# Patient Record
Sex: Female | Born: 1980
Health system: Southern US, Community
[De-identification: ages and names within clinical notes are randomized; demographics above are authoritative.]

## PROBLEM LIST (undated history)

## (undated) DIAGNOSIS — R519 Headache, unspecified: Secondary | ICD-10-CM

## (undated) DIAGNOSIS — Z9889 Other specified postprocedural states: Secondary | ICD-10-CM

## (undated) DIAGNOSIS — I10 Essential (primary) hypertension: Secondary | ICD-10-CM

## (undated) DIAGNOSIS — R51 Headache: Secondary | ICD-10-CM

## (undated) DIAGNOSIS — D649 Anemia, unspecified: Secondary | ICD-10-CM

## (undated) DIAGNOSIS — R112 Nausea with vomiting, unspecified: Secondary | ICD-10-CM

## (undated) DIAGNOSIS — F988 Other specified behavioral and emotional disorders with onset usually occurring in childhood and adolescence: Secondary | ICD-10-CM

## (undated) DIAGNOSIS — R509 Fever, unspecified: Secondary | ICD-10-CM

## (undated) HISTORY — DX: Essential (primary) hypertension: I10

## (undated) HISTORY — DX: Fever, unspecified: R50.9

## (undated) HISTORY — PX: DIAPHRAGM SURGERY: SHX612

## (undated) HISTORY — PX: NISSEN FUNDOPLICATION: SHX2091

## (undated) HISTORY — PX: SPINE SURGERY: SHX786

## (undated) HISTORY — PX: SPINAL CORD STIMULATOR INSERTION: SHX5378

## (undated) HISTORY — DX: Anemia, unspecified: D64.9

## (undated) HISTORY — PX: TONSILLECTOMY: SHX5217

## (undated) HISTORY — PX: SHOULDER SURGERY: SHX246

## (undated) HISTORY — DX: Other specified behavioral and emotional disorders with onset usually occurring in childhood and adolescence: F98.8

---
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
## 2003-12-11 ENCOUNTER — Ambulatory Visit (HOSPITAL_COMMUNITY): Admission: RE | Admit: 2003-12-11 | Discharge: 2003-12-11 | Payer: Self-pay | Admitting: Ophthalmology

## 2003-12-17 ENCOUNTER — Other Ambulatory Visit: Admission: RE | Admit: 2003-12-17 | Discharge: 2003-12-17 | Payer: Self-pay | Admitting: Obstetrics and Gynecology

## 2003-12-18 ENCOUNTER — Encounter: Admission: RE | Admit: 2003-12-18 | Discharge: 2003-12-18 | Payer: Self-pay | Admitting: Neurology

## 2004-02-23 ENCOUNTER — Emergency Department (HOSPITAL_COMMUNITY): Admission: EM | Admit: 2004-02-23 | Discharge: 2004-02-23 | Payer: Self-pay | Admitting: Emergency Medicine

## 2004-04-08 ENCOUNTER — Encounter
Admission: RE | Admit: 2004-04-08 | Discharge: 2004-04-08 | Payer: Self-pay | Admitting: Physical Medicine & Rehabilitation

## 2004-04-13 ENCOUNTER — Ambulatory Visit (HOSPITAL_BASED_OUTPATIENT_CLINIC_OR_DEPARTMENT_OTHER): Admission: RE | Admit: 2004-04-13 | Discharge: 2004-04-13 | Payer: Self-pay | Admitting: Orthopedic Surgery

## 2004-06-01 ENCOUNTER — Encounter: Admission: RE | Admit: 2004-06-01 | Discharge: 2004-06-01 | Payer: Self-pay | Admitting: Orthopedic Surgery

## 2005-01-28 ENCOUNTER — Emergency Department (HOSPITAL_COMMUNITY): Admission: EM | Admit: 2005-01-28 | Discharge: 2005-01-28 | Payer: Self-pay | Admitting: Emergency Medicine

## 2005-04-07 ENCOUNTER — Other Ambulatory Visit: Admission: RE | Admit: 2005-04-07 | Discharge: 2005-04-07 | Payer: Self-pay | Admitting: Obstetrics and Gynecology

## 2006-07-05 ENCOUNTER — Encounter: Admission: RE | Admit: 2006-07-05 | Discharge: 2006-07-05 | Payer: Self-pay | Admitting: Orthopaedic Surgery

## 2006-10-11 ENCOUNTER — Encounter
Admission: RE | Admit: 2006-10-11 | Discharge: 2007-01-09 | Payer: Self-pay | Admitting: Physical Medicine and Rehabilitation

## 2006-10-11 ENCOUNTER — Ambulatory Visit: Payer: Self-pay | Admitting: Physical Medicine and Rehabilitation

## 2006-12-06 ENCOUNTER — Ambulatory Visit: Payer: Self-pay | Admitting: Physical Medicine and Rehabilitation

## 2007-01-01 ENCOUNTER — Encounter
Admission: RE | Admit: 2007-01-01 | Discharge: 2007-01-01 | Payer: Self-pay | Admitting: Physical Medicine and Rehabilitation

## 2007-01-03 ENCOUNTER — Encounter
Admission: RE | Admit: 2007-01-03 | Discharge: 2007-01-10 | Payer: Self-pay | Admitting: Physical Medicine and Rehabilitation

## 2007-02-10 ENCOUNTER — Encounter
Admission: RE | Admit: 2007-02-10 | Discharge: 2007-05-11 | Payer: Self-pay | Admitting: Physical Medicine and Rehabilitation

## 2007-02-10 ENCOUNTER — Ambulatory Visit: Payer: Self-pay | Admitting: Physical Medicine and Rehabilitation

## 2007-04-11 ENCOUNTER — Ambulatory Visit: Payer: Self-pay | Admitting: Physical Medicine and Rehabilitation

## 2007-05-08 ENCOUNTER — Encounter
Admission: RE | Admit: 2007-05-08 | Discharge: 2007-08-06 | Payer: Self-pay | Admitting: Physical Medicine and Rehabilitation

## 2007-05-10 ENCOUNTER — Ambulatory Visit: Payer: Self-pay | Admitting: Physical Medicine and Rehabilitation

## 2007-06-07 ENCOUNTER — Ambulatory Visit: Payer: Self-pay | Admitting: Physical Medicine and Rehabilitation

## 2007-07-25 ENCOUNTER — Encounter
Admission: RE | Admit: 2007-07-25 | Discharge: 2007-10-23 | Payer: Self-pay | Admitting: Physical Medicine and Rehabilitation

## 2007-07-28 ENCOUNTER — Ambulatory Visit: Payer: Self-pay | Admitting: Physical Medicine and Rehabilitation

## 2007-08-02 ENCOUNTER — Encounter
Admission: RE | Admit: 2007-08-02 | Discharge: 2007-08-02 | Payer: Self-pay | Admitting: Physical Medicine and Rehabilitation

## 2007-09-01 ENCOUNTER — Ambulatory Visit: Payer: Self-pay | Admitting: Physical Medicine and Rehabilitation

## 2007-10-13 ENCOUNTER — Ambulatory Visit: Payer: Self-pay | Admitting: Physical Medicine and Rehabilitation

## 2007-11-10 ENCOUNTER — Encounter
Admission: RE | Admit: 2007-11-10 | Discharge: 2008-02-08 | Payer: Self-pay | Admitting: Physical Medicine & Rehabilitation

## 2007-11-13 ENCOUNTER — Ambulatory Visit: Payer: Self-pay | Admitting: Physical Medicine & Rehabilitation

## 2007-11-23 ENCOUNTER — Encounter
Admission: RE | Admit: 2007-11-23 | Discharge: 2008-02-21 | Payer: Self-pay | Admitting: Physical Medicine and Rehabilitation

## 2007-11-24 ENCOUNTER — Ambulatory Visit: Payer: Self-pay | Admitting: Physical Medicine and Rehabilitation

## 2007-12-14 ENCOUNTER — Ambulatory Visit: Payer: Self-pay | Admitting: Physical Medicine & Rehabilitation

## 2007-12-25 ENCOUNTER — Ambulatory Visit: Payer: Self-pay | Admitting: Physical Medicine and Rehabilitation

## 2008-01-11 ENCOUNTER — Ambulatory Visit: Payer: Self-pay | Admitting: Physical Medicine & Rehabilitation

## 2008-01-22 ENCOUNTER — Encounter: Admission: RE | Admit: 2008-01-22 | Discharge: 2008-01-22 | Payer: Self-pay | Admitting: Anesthesiology

## 2008-01-22 ENCOUNTER — Ambulatory Visit: Payer: Self-pay | Admitting: Physical Medicine and Rehabilitation

## 2008-01-29 ENCOUNTER — Ambulatory Visit (HOSPITAL_COMMUNITY)
Admission: RE | Admit: 2008-01-29 | Discharge: 2008-01-29 | Payer: Self-pay | Admitting: Physical Medicine and Rehabilitation

## 2008-02-21 ENCOUNTER — Encounter: Admission: RE | Admit: 2008-02-21 | Discharge: 2008-02-21 | Payer: Self-pay | Admitting: Orthopaedic Surgery

## 2008-03-08 ENCOUNTER — Encounter
Admission: RE | Admit: 2008-03-08 | Discharge: 2008-06-06 | Payer: Self-pay | Admitting: Physical Medicine and Rehabilitation

## 2008-04-08 ENCOUNTER — Ambulatory Visit: Payer: Self-pay | Admitting: Physical Medicine and Rehabilitation

## 2008-05-31 ENCOUNTER — Encounter: Payer: Self-pay | Admitting: Orthopedic Surgery

## 2008-05-31 ENCOUNTER — Ambulatory Visit (HOSPITAL_COMMUNITY): Admission: RE | Admit: 2008-05-31 | Discharge: 2008-05-31 | Payer: Self-pay | Admitting: Radiology

## 2008-06-04 ENCOUNTER — Encounter (INDEPENDENT_AMBULATORY_CARE_PROVIDER_SITE_OTHER): Payer: Self-pay | Admitting: Orthopedic Surgery

## 2008-06-04 ENCOUNTER — Inpatient Hospital Stay (HOSPITAL_COMMUNITY): Admission: RE | Admit: 2008-06-04 | Discharge: 2008-06-08 | Payer: Self-pay | Admitting: Orthopedic Surgery

## 2008-08-01 ENCOUNTER — Encounter: Admission: RE | Admit: 2008-08-01 | Discharge: 2008-08-01 | Payer: Self-pay | Admitting: Orthopedic Surgery

## 2008-10-11 ENCOUNTER — Encounter: Admission: RE | Admit: 2008-10-11 | Discharge: 2008-10-11 | Payer: Self-pay | Admitting: Orthopedic Surgery

## 2008-10-15 ENCOUNTER — Encounter: Admission: RE | Admit: 2008-10-15 | Discharge: 2008-10-15 | Payer: Self-pay | Admitting: Orthopedic Surgery

## 2008-12-09 ENCOUNTER — Encounter: Admission: RE | Admit: 2008-12-09 | Discharge: 2008-12-09 | Payer: Self-pay | Admitting: Orthopedic Surgery

## 2009-03-14 ENCOUNTER — Encounter: Admission: RE | Admit: 2009-03-14 | Discharge: 2009-03-14 | Payer: Self-pay | Admitting: Orthopedic Surgery

## 2010-02-28 ENCOUNTER — Encounter: Payer: Self-pay | Admitting: Ophthalmology

## 2010-03-01 ENCOUNTER — Encounter: Payer: Self-pay | Admitting: Physical Medicine and Rehabilitation

## 2010-05-12 ENCOUNTER — Encounter (HOSPITAL_COMMUNITY)
Admission: RE | Admit: 2010-05-12 | Discharge: 2010-05-12 | Disposition: A | Discharge status: Home / Self Care | Payer: PRIVATE HEALTH INSURANCE | Source: Ambulatory Visit | Attending: Obstetrics and Gynecology | Admitting: Obstetrics and Gynecology

## 2010-05-12 LAB — SURGICAL PCR SCREEN: Staphylococcus aureus: NEGATIVE

## 2010-05-12 LAB — CBC
Hemoglobin: 12.5 g/dL (ref 12.0–15.0)
MCH: 28 pg (ref 26.0–34.0)
MCV: 86.1 fL (ref 78.0–100.0)
RBC: 4.46 MIL/uL (ref 3.87–5.11)

## 2010-05-12 LAB — RPR: RPR Ser Ql: NONREACTIVE

## 2010-05-19 ENCOUNTER — Inpatient Hospital Stay (HOSPITAL_COMMUNITY)
Admission: RE | Admit: 2010-05-19 | Discharge: 2010-05-22 | DRG: 766 | Disposition: A | Discharge status: Home / Self Care | Payer: PRIVATE HEALTH INSURANCE | Source: Ambulatory Visit | Attending: Obstetrics and Gynecology | Admitting: Obstetrics and Gynecology

## 2010-05-19 DIAGNOSIS — O99892 Other specified diseases and conditions complicating childbirth: Principal | ICD-10-CM | POA: Diagnosis present

## 2010-05-19 DIAGNOSIS — M539 Dorsopathy, unspecified: Secondary | ICD-10-CM | POA: Diagnosis present

## 2010-05-19 LAB — PROTIME-INR
INR: 3.1 — ABNORMAL HIGH (ref 0.00–1.49)
Prothrombin Time: 33.9 seconds — ABNORMAL HIGH (ref 11.6–15.2)

## 2010-05-20 LAB — APTT: aPTT: 28 seconds (ref 24–37)

## 2010-05-20 LAB — URINALYSIS, ROUTINE W REFLEX MICROSCOPIC
Bilirubin Urine: NEGATIVE
Glucose, UA: NEGATIVE mg/dL
Hgb urine dipstick: NEGATIVE
Ketones, ur: NEGATIVE mg/dL
Nitrite: NEGATIVE
Protein, ur: NEGATIVE mg/dL
Specific Gravity, Urine: 1.023 (ref 1.005–1.030)
Urobilinogen, UA: 0.2 mg/dL (ref 0.0–1.0)
pH: 6 (ref 5.0–8.0)

## 2010-05-20 LAB — BASIC METABOLIC PANEL
CO2: 28 mEq/L (ref 19–32)
Chloride: 103 mEq/L (ref 96–112)
Chloride: 105 mEq/L (ref 96–112)
GFR calc Af Amer: >60 mL/min (ref >60)
GFR calc Af Amer: >60 mL/min (ref >60)
GFR calc non Af Amer: >60 mL/min (ref >60)
Potassium: 4 mEq/L (ref 3.5–5.1)
Sodium: 133 mEq/L — ABNORMAL LOW (ref 135–145)
Sodium: 140 mEq/L (ref 135–145)

## 2010-05-20 LAB — COMPREHENSIVE METABOLIC PANEL
AST: 17 U/L (ref 0–37)
Albumin: 3.8 g/dL (ref 3.5–5.2)
Alkaline Phosphatase: 54 U/L (ref 39–117)
CO2: 25 mEq/L (ref 19–32)
Chloride: 107 mEq/L (ref 96–112)
Creatinine, Ser: 0.73 mg/dL (ref 0.4–1.2)
GFR calc Af Amer: >60 mL/min (ref >60)
GFR calc non Af Amer: >60 mL/min (ref >60)
Potassium: 4.6 mEq/L (ref 3.5–5.1)
Total Bilirubin: 0.5 mg/dL (ref 0.3–1.2)

## 2010-05-20 LAB — CBC
HCT: 35 % — ABNORMAL LOW (ref 36.0–46.0)
HCT: 40.9 % (ref 36.0–46.0)
Hemoglobin: 8.1 g/dL — ABNORMAL LOW (ref 12.0–15.0)
Hemoglobin: 10.4 g/dL — ABNORMAL LOW (ref 12.0–15.0)
Hemoglobin: 11.8 g/dL — ABNORMAL LOW (ref 12.0–15.0)
MCHC: 32.3 g/dL (ref 30.0–36.0)
MCHC: 34.7 g/dL (ref 30.0–36.0)
MCV: 87.4 fL (ref 78.0–100.0)
MCV: 88.3 fL (ref 78.0–100.0)
MCV: 87.6 fL (ref 78.0–100.0)
Platelets: 159 10*3/uL (ref 150–400)
Platelets: 333 10*3/uL (ref 150–400)
RBC: 3.43 MIL/uL — ABNORMAL LOW (ref 3.87–5.11)
RBC: 3.96 MIL/uL (ref 3.87–5.11)
RBC: 4.67 MIL/uL (ref 3.87–5.11)
WBC: 13.4 10*3/uL — ABNORMAL HIGH (ref 4.0–10.5)
WBC: 9.6 10*3/uL (ref 4.0–10.5)
WBC: 8.4 10*3/uL (ref 4.0–10.5)

## 2010-05-20 LAB — URINE CULTURE

## 2010-05-20 LAB — VITAMIN D 25 HYDROXY (VIT D DEFICIENCY, FRACTURES): Vit D, 25-Hydroxy: 33 ng/mL (ref 30–89)

## 2010-05-20 LAB — TYPE AND SCREEN
ABO/RH(D): A POS
ABO/RH(D): A POS
Antibody Screen: NEGATIVE
Antibody Screen: NEGATIVE

## 2010-05-20 LAB — DIFFERENTIAL
Lymphocytes Relative: 26 % (ref 12–46)
Lymphs Abs: 2.1 10*3/uL (ref 0.7–4.0)
Neutrophils Relative %: 64 % (ref 43–77)

## 2010-05-20 LAB — URINE MICROSCOPIC-ADD ON

## 2010-05-20 LAB — PROTIME-INR
INR: 1.6 — ABNORMAL HIGH (ref 0.00–1.49)
INR: 3.4 — ABNORMAL HIGH (ref 0.00–1.49)
INR: 1 (ref 0.00–1.49)
Prothrombin Time: 20.2 seconds — ABNORMAL HIGH (ref 11.6–15.2)
Prothrombin Time: 12.9 seconds (ref 11.6–15.2)

## 2010-05-20 LAB — ABO/RH: ABO/RH(D): A POS

## 2010-05-26 ENCOUNTER — Inpatient Hospital Stay (HOSPITAL_COMMUNITY): Admission: AD | Admit: 2010-05-26 | Payer: Self-pay | Source: Home / Self Care | Admitting: Obstetrics and Gynecology

## 2010-05-27 ENCOUNTER — Ambulatory Visit (HOSPITAL_COMMUNITY)
Admission: RE | Admit: 2010-05-27 | Discharge: 2010-05-27 | Disposition: A | Discharge status: Home / Self Care | Payer: PRIVATE HEALTH INSURANCE | Source: Ambulatory Visit | Attending: Obstetrics and Gynecology | Admitting: Obstetrics and Gynecology

## 2010-06-23 NOTE — Assessment & Plan Note (Signed)
[name] is a 30 year old single female who is followed in our  [facility] for chronic low back pain.  She is known  to have scoliosis as well as degenerative disk problems, mild facet  arthropathy at the L5-S1 segment.   She has returned to clinic for a refill of her medications and to  discuss possible injections for her lumbar spine to help manage her  pain.   She is also known to have stable L1 and L2 superior endplate  deformities.  She states her average pain is about a 7 on a scale of 10.  Generally activity is moderately affected.  Sleep tends to be poor.  She  gets fair relief with current medications.  Her pain is localized across  the low back worse on the left lumbosacral junction region.  She does  have some pain which radiates down the left leg to the foot as well.  The pain is worse with coughing or sneezing, typically she does get some  numbness in the foot on occasion especially after she has been up for a  while.  She also has some pain with extension.  She has difficulty  bearing full weight through the left lower extremity.   FUNCTIONAL STATUS:  She is able to walk about 4-5 minutes at a time.  She is able to climb stairs.  She does not drive.  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  She is independent with self-care.  She  needs assistance with heavier household tasks.   She denies problems controlling her bowel or bladder.  She reports no  problems with depression, anxiety, or suicidal ideations.  She has had  no changes on review of systems, her past medical, social, or family  histories.   [facility]:  1. Ultracet 1 p.o. at bedtime.  2. Tizanidine 4 mg 1 p.o. at bedtime.   She does not take nonsteroidal antiinflammatory medication at this time,  but would like to consider taking an occasional ibuprofen.   PHYSICAL EXAMINATION:  VITAL SIGNS:  Are as follows;  128/64, pulse 73,  respiration 18, and 99% saturated on room air.  GENERAL:  She is a well-developed, well-nourished woman who does not  appear in any distress.  She is oriented x3.  Her speech is clear.  Her  affect is bright.  She is alert, cooperative, pleasant, and articulate.   She has an obvious thoracolumbar scoliosis.  She stands with her left  knee slightly flexed with more weight distributed throughout the right  lower extremity.  She states she tends to bear more weight as she is  standing in the forefoot of her left foot.   She has significant limitations in the lumbar motion in all planes, not  more than 5-10 degrees of forward flexion, extension, and lateral  flexion.   She ambulates with asymmetry and an antalgic gait.   As she is seated, her reflexes are noted to be 2+ at the patellar and  Achilles tendons.  She does not have abnormal tone.  Her motor strength  is 5/5 at hip flexors, knee extensors, dorsiflexors, and plantar  flexors.  EHL is slightly weaker on the left than on the right by half a  grade.  Straight leg raise appears to be negative bilaterally.  Internal  and external rotation of the hip does not bother her.   She has significant tenderness to palpation throughout  the lumbar  paraspinal musculature especially along the lumbosacral junction.   IMPRESSION:  1. Chronic low back pain, scoliosis convexed to the right with mild      wedge deformity at L1-L2.  2. Degenerative facet changes on the left L5-S1.  3. History of disc protrusion at multiple levels L2-3, mild left      lateral disc protrusion L3-4, mild left lateral disc protrusion L4-      5, left eccentric broad based disc protrusion with lateral recess      and bilateral component, mild narrowing of the left lateral recess      stenosis with mild facet ligamentum flavum hypertrophy.   PLAN:  We will refill the following medications for her.  Tizanidine 4  mg 1 p.o. at bedtime, #30 and  Ultracet 1 p.o. at bedtime p.r.n. back or  leg pain, #30, 3 refills of both.   She is interested in pursuing injections to help alleviate her pain if  that is possible.  We discussed medial branch block injections,  sacroiliac joint injections, and lumbar epidural steroid injections.   Given the fact that she does have some slight EHL weakness on the left  as well as increased pain with coughing and sneezing.  A recent MRI done  on August 02, 2007, suggested some narrowing of the left lateral recess at  L4-5, may consider starting with a lumbar epidural steroid injection and  assessing pain.  She understands that her pain is most likely caused by  multiple factors and may consider other injections to help manage her  pain in the future if the epidural steroid is not particularly  successful for her.  We will see her back in a month.  We will have her  set up for a lumbar epidural steroid injection to see if we can improve  some of the left buttock and leg components of her pain.  Her Oswestry  percentage of disability today is 53%.           ______________________________  Brantley Stage, M.D.     DMK/MedQ  D:  10/13/2007 10:15:23  T:  10/14/2007 02:03:17  Job #:  [id number]

## 2010-06-23 NOTE — Assessment & Plan Note (Signed)
Dominique Hughes is a 30 year old single female who is followed in our pain  and rehabilitative clinic.  Her chief complaint is low back pain.  She  recently followed up with Dr. Jeral Fruit who has sent a consultation report  which is attached to the chart today.   Dominique Hughes is requesting a refill of her tizanidine and Ultracet today.  She states she has had a bout of pneumonia and was on some antibiotics  earlier this month, and was unable to attend physical therapy.  She will  be starting therapy next week, however.   She states she was unable to get her hip x-rays, but intends to do this  within the next couple of weeks.   Her average pain is about a 7 on a scale of 10, localized to the low  lumbar region, worse with activity, walking, bending, sitting.  Improves  with rest and medication.   Pain is described as constant, sharp and aching in nature, occasionally  stabbing as well.   She gets fair relief with the current meds prescribed by our clinic.   She is able to walk between 4 and 5 minutes at a time.  She is able to  climb stairs.  She is currently not driving.  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   She is independent with self care, needs some assistance with higher  level household tasks.   Admits to some difficulty with walking.   REVIEW OF SYSTEMS:  Noncontributory.   PAST MEDICAL/SOCIAL/FAMILY HISTORY:  Otherwise unchanged other than that  already noted.   MEDICATIONS PROVIDED BY OUR CLINIC:  1. Ultracet 1 p.o. nightly p.r.n. #30.  2. Lidoderm p.r.n.  3. Tizanidine 4 mg 1 p.o. nightly.   She also takes Adderall XR, metoprolol, and Ortho Tri-Cyclen Lo.   PHYSICAL EXAMINATION:  Blood pressure is 142/76, pulse 79, respirations  18, and 98% saturated on room air.  She is a well-developed, well-nourished female who does not appear in  any distress.  She is oriented x3, her speech is clear.  She does sound  a little bit congested, yet her affect is bright, she  is alert,  cooperative and pleasant.  She follows commands without difficulty.  Transitioning from sitting to standing is done with ease.  Gait in the  room is slightly asymmetric.  She does not have a heel lift yet.  Lumbar flexion, extension are limited to less than 5 degrees in either  direction.  Has tenderness in the lumbar area and paraspinal musculature  with palpation.  Reflexes are slightly brisk without abnormal tone noted in the lower  extremities.  No clonus was noted.  Motor strength was 5/5 at hip flexors, knee extensors, dorsiflexors,  plantar flexors.  She does have some difficulty giving full effort due to increased pain  during exertion at hip flexion and knee extension.  Internal and external rotation of the hips are assessed, significantly  decreased internal rotation in the right hip, less than 5 to 10 degrees  with internal rotation.   IMPRESSION:  1. Chronic low back pain.  2. Thoracolumbar scoliosis.  3. Mild wedge deformity at L1-L2 per MRI.  4. Degenerative facet changes on left L5-S1.  5. Small central disk protrusion T7-T8, and at C5-C6.  6. Significant decreased range of motion in the right hip.   PLAN:  Will refill her tizanidine 4 mg 1 p.o. nightly, also will refill  her Ultracet 1 p.o. nightly p.r.n. back pain.  Awaiting radiographs of  her hips.  She will be getting physical therapy next month, have them  assess her for heel lift.  Also consider corset for her flare ups of the  low back.   Will see her back in a month.  Dominique Hughes has not exhibited any aberrant  behavior with her medication use.  She takes her medications as  prescribed, and is planning to participate in rehabilitation.  I will  see her back in a month.           ______________________________  Brantley Stage, M.D.     DMK/MedQ  D:  04/12/2007 09:35:28  T:  04/12/2007 10:17:03  Job #:  [id number]
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
## 2010-06-23 NOTE — Assessment & Plan Note (Signed)
Dominique Hughes is a pleasant 30 year old woman, who is followed in our [facility], who is here for a brief recheck.  She  underwent a second round of medial branch block on January 11, 2008.  She reports about 35-40% improvement for about an hour after the  injection.  This is similar to the first round of medial branch blocks  she had in early November.   She is back in today and reports the average pain about 8 on a scale of  10 with very little ability to move her low back.  Pain is fairly  constant, worse in the evening and at night.  Sleep tends to be poor.  She has pain with any kind of walking, bending, prolonged sitting or  standing, improves with rest and medication.  She gets fair relief with  current meds.   FUNCTIONAL STATUS:  She is able to walk 4-5 minutes at a time.  She can  climb stairs.  She does not drive.  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   No change in past medical, social, or family histories since last visit.   PHYSICAL EXAMINATION:  VITAL SIGNS:  Blood pressure is 120/85,  respirations 18, and 97% saturated on room air.  GENERAL:  She is a well-developed, well-nourished woman, who does not  appear in any distress.  She is oriented x3.  Speech is clear.  Affect  is bright.  She is alert, cooperative, and pleasant.  She follows  commands without difficulty and answerers questions appropriately.   Cranial nerves and coordination are grossly intact.  Reflexes are 2+ at  the patellar and Achilles tendons without any abnormal tone or clonus.  No tremors are appreciated as well.  Her sensation is intact in the  lower extremities.   She has significant limitations with respect to lumbar motion in all  planes, not more than 5-10 degrees in any direction.  She has quite a  bit of tenderness with palpation in the lower  lumbar segments and in the  paraspinal musculature.  Her motor strength, however, is 5/5 in the  lower extremities with the exception of hip flexors, which she is unable  to give full effort because of increased back pain during this maneuver.   IMPRESSION:  1. Chronic low back pain, scoliosis come back to the right with mild      wedge deformity, L1-2.  2. Degenerative facet changes on the left at L5-S1.  3. History of disk protrusion at multiple levels including L2-3,      mildly at L3-4 and L4-5.  4. She does not need refill on her tizanidine or Ultracet at this      time.  She would like to trial sacroiliac joint injection that she      does have pain over the sacroiliac joint and an antalgic gait with      pain over this area as well.  Pain does radiate through especially      the left posterior buttock region.  May also consider moving toward      a radiofrequency of the lumbar spine.  However, we would like to      trial the sacroiliac joint injection prior to moving toward the      radiofrequency.  We will see her back in a month.  ______________________________  Brantley Stage, M.D.     DMK/MedQ  D:  01/22/2008 12:51:42  T:  01/23/2008 06:26:13  Job #:  [id number]

## 2010-06-23 NOTE — Procedures (Signed)
NAME:  Dominique Hughes, SPERRY NO.:  [id number]   MEDICAL RECORD NO.:  [id number]          PATIENT TYPE:  REC   LOCATION:  [facility]                          FACILITY:  [facility]   PHYSICIAN:  Erick Colace, M.D.DATE OF BIRTH:  Oct 14, 1980   DATE OF PROCEDURE:  DATE OF DISCHARGE:                               OPERATIVE REPORT   PROCEDURE:  This is bilateral L5 dorsal ramus injection and bilateral L4  medial branch block.   INDICATIONS:  Low back pain, pain is only partially responsive to  medication management, history of scoliosis.   Informed consent was obtained after describing risks and benefits of the  procedure with the patient.  These include bleeding, bruising, and  infection.  She elects to proceed and has given written consent.  The  patient was placed prone on the fluoroscopy table, Betadine prepped,  sterile draped, and 25-gauge 1-1/2-inch needle was used to anesthetize  the skin and subcutaneous  tissue with 1% lidocaine x2 mL.  Then, a 22-  gauge, 2-1/2-inch spinal needle was inserted under fluoroscopic  guidance, first starting at the right S1, SAP, and sacroiliac junction,  bone contact made, confirmed on lateral imaging.  Omnipaque 180 x0.5 mL  demonstrated no intravascular uptake, then 0.5 mL of solution containing  1 mL of 4 mg/mL dexamethasone and 1 mL of 1% lidocaine.  Then, the left  L5 SAP transverse junction was targeted.  Bone contact made and  confirmed with lateral imaging.  Omnipaque 180 x0.5 mL demonstrated no  intravascular uptake, then 0.5 mL of dexamethasone lidocaine solution  was injected.  The same procedure was repeated on the right side using  same needle injected and technique.  The patient tolerated procedure  well.  Pre and Post-injection vitals stable.  Post-injection  instructions given.  Now follow up with Dr. Pamelia Hoit in 2 weeks to  assess efficacy.  Pre and post-injection 7/10.  The patient will follow  up overtime.      Erick Colace, M.D.  Electronically Signed     AEK/MEDQ  D:  12/14/2007 10:45:15  T:  12/14/2007 23:37:18  Job:  [id number]

## 2010-06-23 NOTE — Assessment & Plan Note (Signed)
[name] is a 30 year old single female who is followed in our  [facility] for chronic low back pain.  She was last  seen by me on October 13, 2007.  In the interim, she has undergone a  left L4-L5 transforaminal lumbar epidural steroid injection.  She states  that for about an hour to an hour and half, she had good relief and then  she had a return of pain and pain going down the left leg.   Pain typically is localized to the low left lumbar region through the  buttock and to the posterior left thigh.  She has some pain in the right  lower extremity.  She describes the pain is probably between 60-70% on  the left versus 30-40% on the right.   Average pain is about 7-8 on a scale of 10.  It is fairly constant,  worse with prolonged activities such as sitting, standing, and walking.  Improves with rest and medications, interferes significantly with  activity.  Pain is fairly constant throughout the day.   FUNCTIONAL STATUS:  She is able to walk 4-5 minutes at a time.  She is  able to climb stairs and does not drive.  She is independent with self-  care and needs some assistance with heavier household tasks.   Denies problems controlling bowel or bladder.  Denies numbness,  tingling, or tremors.  Does admit some difficulty walking and denies  depression, anxiety, or suicidal ideation.   MEDICATIONS:  Provided through this clinic include;  1. Ultracet which she takes 1 at night.  2. Tizanidine 4 mg nightly.  3. Naprosyn 500 mg b.i.d. p.r.n.  This was discontinued for stomach      upset.   No other changes in past medical, social, or family history since last  visit.   PHYSICAL EXAMINATION:  VITAL SIGNS:  Blood pressure is 130/82, pulse 73,  respirations 18 and 99% saturated on room air.  GENERAL:  She is a well-developed, well-nourished woman who does not  appear in any distress.  She is oriented x3.  Speech is clear.  Affect  is bright.  She is alert,  cooperative and pleasant.  She follows  commands without difficulty.   Cranial nerves are grossly intact.  Coordination is intact.  Reflexes  are slightly brisk in bilateral lower extremities.  No clonus is noted.  No abnormal tone is appreciated.   Sensation is intact to light touch and pinprick in both lower  extremities.  Motor strength is 5/5 in the right lower extremity.  She  has difficulty giving full effort with left hip flexion due to pain in  the back, and she has 4/5 EHL strength on the left versus the right.  She has an antalgic gait.  Her balance is good.  She has very little  motion in the lumbar spine with respect to forward flexion, lateral  flexion, or extension not more than a few degrees in any of these  directions.   Tenderness is noted in the left, especially in the left lumbosacral  junction.   IMPRESSION:  1. Chronic low back pain, scoliosis contact to the right with mild      wedge deformity at L1-L2.  2. Degenerative facet changes on the left at L5-S1.  3. History of disk protrusion at multiple levels including L2-L3,      mildly at L3-L4 and L4-L5.   PLAN:  Continue tizanidine 4 mg nightly, Ultracet 1 p.o. p.r.n. at  night  as well.   She would like to pursue medial branch block at L5-S1 with Dr.  Wynn Banker.  She did trial the epidural steroid injection.  It did not  give her the relief she was looking for.  Per MRI, she is known to have  some facet changes at the lower lumbar segment.  We will have her set up  for this, and I will see her back in a month.  She does not need any  refills on medications today.  She did bring in a [employer] evaluation for permanent impairment Form 25R.  I  will see her back in a month.           ______________________________  Brantley Stage, M.D.     DMK/MedQ  D:  11/24/2007 10:48:17  T:  11/24/2007 23:15:05  Job #:  [id number]

## 2010-06-23 NOTE — Assessment & Plan Note (Signed)
HISTORY OF PRESENT ILLNESS:  [name] is a 30 year old single  female who is followed in our pain and rehabilitative clinic for low  back pain.  She is known to have a progressive scoliosis apparently over  the last couple of years, dating back to the time when she fell at work  on December 6.  She has been in some physical therapy, including an aqua  therapy program and has been making very slow progress.  She is  currently working on doing some upper extremities flexibility exercises  as well as some work on therapeutic ball to work on truncal  strengthening.  She is also using some Thera-Band.   She states she continues to have some pain at night which is often is  keep8ing her up.  She has been taking Ultracet 1 p.o. at night as well  as Tizanidine.  However, she still seems to wake up intermittently  throughout the night.  She is also having pain whether or not she is  active or not.  She is noticing pain even when she is inactive.   She also had noted worsening of her left lower extremity and believes  she may be getting some weakness in the left leg and she notices this  when she is up walking for a few months.  This had been rather insidious  she claims and may have been going on for several months now.  Her  average pain is 7-8 out of a scale of 10 and is predominantly localized  across the low back region, worse with any type of activity; but also  she notes that she has pain present even when she is not moving or  active.   Again, she reports sleep tends to be poor.  She gets fair relief with  current meds she takes from our clinic.   MEDICATIONS FROM OUR CLINIC:  1. Ultracet 1 p.o. every night.  2. Lidoderm on a p.r.n. basis.  3. Tizanidine 4 mg at night.   She is able to walk about 4-5 minutes at a time.  She is able to climb  stairs and she does not drive.  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  She does need assistance with higher level activity.   Regarding neurologic symptoms, she denies problems controlling bowel or  bladder.  She has noticed some left leg weakness beginning about in  February of this year and has noted some trouble walking about February  as well.  She continues to have low back spasms which are not new.  She  denies depression, anxiety.  Denies suicidal ideation.   REVIEW OF SYSTEMS:  Otherwise noncontributory.   PAST MEDICAL HISTORY/SOCIAL HISTORY/FAMILY HISTORY:  No change since  last visit.   PHYSICAL EXAMINATION:  VITAL SIGNS:  Blood pressure 143/95, pulse 95,  respirations 18, saturation 98% on room air.  GENERAL:  A well-  developed, well-nourished female, who does not appear in any distress.  She is oriented x3.  Speech is clear.  Outlook is bright.  She is alert,  cooperative and pleasant.  She follows commands without difficulty.  NEUROLOGIC:  Transitioning from sitting to standing as well as gait in  the room is uneven and slight antalgic.  She has very little motion with  lumbar forward flexion, extension or lateral flexion, not more than a  few degrees in either direction, and seated her reflexes are 2+  in  patellar and Achilles tendon.  I do not appreciate any abnormal tone or  clonus.  She has difficulty giving full strength at hip flexion  bilaterally; the left seems to be a little bit weaker, possibly due to  increased pain or slight weakness.  She had difficulty even holding left  leg with hip flexion against gravity.  She is unable to extend her legs  fully in a seated position.  Her motor strength of knees and knee  flexion and extension are 5/5, ankle dorsiflexion and plantarflexion are  5/5.  She has slightly decreased EHL strength on the left compared to  the right.   IMPRESSION:  1. Chronic low back pain.  2. Scoliosis convexed to the right.  3. Mild wedge deformity at L1-2 for MRI.  4. Degenerative facet changes  on left at L5-S1.  5. History of small central disk protrusion at T7-8 and C5-6.   PLAN:  In light of her leg pain, worsening apparent weakness of the left  lower extremity, pain even without activity, and apparent progressive  scoliosis, I will go ahead and order an MRI with contrast today for her.  We will refill her Ultracet 1 p.o. every night #30, as well as  tizanidine 4 mg 1 p.o. every night #30 as well.  Apparently she is  seeing Dr. De Nurse for injections and believes he may have put her on some  Celebrex as well which she has found to be somewhat beneficial.  We will  see her back in a month and let her know if any pertinent changes on her  MRI.           ______________________________  Brantley Stage, M.D.     DMK/MedQ  D:  06/07/2007 13:08:22  T:  06/07/2007 13:44:04  Job #:  [id number]

## 2010-06-23 NOTE — Procedures (Signed)
NAME:  Dominique Hughes, Dominique Hughes NO.:  [id number]   MEDICAL RECORD NO.:  [id number]          PATIENT TYPE:  REC   LOCATION:  [facility]                          FACILITY:  [facility]   PHYSICIAN:  Erick Colace, M.D.DATE OF BIRTH:  12/27/1980   DATE OF PROCEDURE:  11/13/2007  DATE OF DISCHARGE:                               OPERATIVE REPORT   PROCEDURE:  Left L4-L5 transforaminal lumbar epidural injection.  The  patient with back pain and left lower extremity pain with a left EHL  weakness.  Pain interferes with meal prep, household duties, shopping,  and interferes with walking tolerance.   Pain has only partial response to medication management including other  conservative care.   Informed consent was obtained after describing the risks and benefits of  the procedure.  These include bleeding, bruising, infection, temporary  and permanent paralysis.  She elects to proceed and has given written  consent.  The patient placed prone on the fluoroscopy table.  Betadine  prep, sterile drape, a 25-gauge inch and half needle was used to  anesthetize the skin and subcu tissue 1% lidocaine x 3 mL.  Then, a 22-  gauge 3-1/2-inch spinal needle was inserted under fluoroscopic guidance  into the left L4-L5 intervertebral foramen, AP, lateral, and oblique  imaging utilized.  Omnipaque 180 under live fluoro demonstrated good  nerve root and epidural spread followed by injection of 3 mL of solution  containing 1 mL of 10 mg/mL of dexamethasone and 3 mL of 1% MPF  lidocaine.  The patient tolerated procedure well.  Pre and postinjection  vitals stable.  Post injection instructions given.  No post immediate  postoperative complications.  Follow up with Dr. Pamelia Hoit in 2 weeks.      Erick Colace, M.D.  Electronically Signed     AEK/MEDQ  D:  11/13/2007 09:46:32  T:  11/14/2007 08:33:43  Job:  [id number]

## 2010-06-23 NOTE — Assessment & Plan Note (Signed)
Dominique Hughes is a 30 year old single female who is followed in our Pain  and Rehabilitative Clinic.  She is known to have progressive scoliosis  apparently for the last couple of years dating back to the time she fell  at work in December 2006.  She has been involved in physical therapy  including aqua therapy program and has made very slow progress.  She is  currently doing some upper extremity flexibility exercises as well as  some work on a therapeutic balls for truncal strengthening.  She is also  using Thera-Band.   At the last visit, she had complained of difficulty sleeping at night  due to pain.  She reports pain when she bears down with Valsalva or  coughing or sneezing or with bowel movements.  She reports that there is  a generalized feeling of weakness in the left lower extremity when she  has been up walking for a while as well as increased pain in the left  upper buttock region.  At the last visit, an MRI with and without  contrast was ordered; however, Ms. Dickman states that her grandmother  passed away and she was unable to obtain the MRI.  The facility also  required that she have a recent creatinine level done within 2 weeks of  the exam, which had not been ordered.   Her pain continues to be activity related and worse with any kind of  prolonged postures, sitting, standing, walking, etc.  She is limited to  about 4-5 minutes of walking.   Her average pain is about 7-8 on a scale of 10.  It is fairly constant  pain.  Increases significantly with any kind of general activity and  significantly is impacting her enjoyment of life and relationship with  others.   Pain improves with rest and medication.   MEDICATIONS:  Provided by this clinic include,  1. Ultracet 1 p.o. every night.  2. Lidoderm on a p.r.n. basis.  3. Tizanidine 4 mg nightly.   She also uses 400 mg of ibuprofen twice a day.   She is able to climb stairs.  She does not drive.  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  She is continuing her home exercise  program doing core strengthening exercises.   She denies problems controlling bowel or bladder.  She denies any  numbness.  Denies depression or anxiety.  Denies suicidal ideation.  Does admit to the trouble walking and spasms.   REVIEW OF SYSTEMS:  Noncontributory and essentially negative.  She does  complain that when she walks for a few minutes, she does feel as though  the left leg is going to give way.   No changes in past medical or family history.   SOCIAL HISTORY:  Positive for recent death of her grandmother.   PHYSICAL EXAMINATION:  VITAL SIGNS:  Blood pressure is 128/83, pulse is  62, respiration 18, and 100% saturated on room air.  GENERAL:  She is a well-developed, well-nourished female who appears her  stated age and does not appear in any distress.  She is oriented x3.  Speech is clear.  Affect is bright.  She is alert, cooperative, and  pleasant.  She follows commands without any difficulty.   Transitioning from sitting to standing is slow.  Her gait is asymmetric.  She has a apparent short leg on the left, and during heel strike, she  seems to hit a bit harder on that side.  She  has a scoliosis convex in  the lumbar spine to the right.   Her lumbar range of motion is limited to about not more than 10 degrees  in any direction.  She has pain behaviors during these movements.  Her  reflexes are 2+ in patellar and Achilles tendon.  Babinski is neutral.  No abnormal tone is noted.  No clonus is noted.  She has tenderness with  palpation in the lower lumbar segments as well as over the left  sacroiliac joint.   Her motor strength is 5/5.  Although, she has trouble giving full effort  with hip flexion due to pain increased in the low back with Valsalva and  on manual muscle testing.  She also has a weak EHL in the left compared  to the right.   IMPRESSION:  1. Chronic low back pain.  2. Scoliosis  convex to the right.  3. Mild wedge deformity at L1 and L2 per magnetic resonance imaging.  4. Degenerative facet changes on left at L5-S1.  5. History of small central disk protrusion at T7-8 and C5-6.   PLAN:  We will refill the following medications for her,  1. Ultracet 1 p.o. nightly p.r.n., #30.  2. Tizanidine 4 mg 1 p.o. nightly, #30.  3. We will switch her to Naprosyn from ibuprofen 500 mg 1 p.o. daily      to b.i.d., #45, 2 refills on all above medications.   We will obtain a creatinine on her today.  We will send her for MRI due  to the history of possible progressive scoliosis, night pain, pain with  Valsalva, left leg weakness, and very little lumbar motion in all  planes.  We will see her back after the scan.  She is also working on  slowly increasing her tolerance to a heel-lift on the left.  She is  using a half-inch heel-lift and is using it a couple of hours a day at  this point.   Ms. Ostlund has seen Dr. Noel Gerold, Dr. Murray Hodgkins, Dr. Jeral Fruit, Dr. Alveda Reasons, Dr.  Modesto Charon, and Dr. Gerilyn Pilgrim, in the past in consultation for her back pain.           ______________________________  Brantley Stage, M.D.     DMK/MedQ  D:  07/28/2007 12:06:03  T:  07/29/2007 04:33:08  Job #:  [id number]
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
## 2010-06-23 NOTE — Discharge Summary (Signed)
NAME:  Dominique Hughes, Dominique Hughes NO.:  [id number]   MEDICAL RECORD NO.:  [id number]          PATIENT TYPE:  INP   LOCATION:  5040                         FACILITY:  [facility]   PHYSICIAN:  Nelda Severe, MD      DATE OF BIRTH:  May 27, 1980   DATE OF ADMISSION:  06/04/2008  DATE OF DISCHARGE:  06/08/2008                               DISCHARGE SUMMARY   DIAGNOSIS:  L5-S1 annular tear.   BRIEF HISTORY:  The patient was admitted to [facility] on June 04, 2008, by Dr. Nelda Severe for ProDisc replacement of L5-S1.  Postoperatively, she was moving actively feet and toes, no major  complaints.  Blood loss estimated less than 150 mL.  She was admitted to  room 5014.  On postop day #1, the patient is stable, afebrile,  hemoglobin 11.8.  Distally, neurovascularly, motor intact.  Calves soft.  States that leg pain is better as well as back pain feels different.  Physical Therapy came in for evaluation of mobility.  On postop day #2,  the patient ready to discontinue the Foley, KVO fluids, discontinued PCA  the following morning.  Continue with physical therapy.  She has  positive bowel sounds.  No flatulence yet.  INR is 1.6 on Coumadin  dosing of 5 mg.  On postop day #3, the patient is eating solid foods,  now she has passed flatulence.  INR is 3.4.  We will reduce the dose to  3 mg.  Distally neurovascularly intact.  Incision is clean, dry, and  intact.  We discontinued the dressing, discontinued PCA, discontinued  central line.  Today, Jun 08, 2008, the patient's INR is 3.1.  She is  grossly neurovascularly motor intact.  Incision is clean, dry, and  intact.  Central line discontinued yesterday, dressing removed.   FINAL DIAGNOSIS:  L5-S1 annular tear.   PLAN:  She is going to walk for exercise.  No bending, stooping,  lifting.  She is going to follow up in the office Dr. Nelda Severe in 4  weeks.   DIET:  Regular.   DISPOSITION:  Stable.   She will be sent home  with pain medication to include Norco 10/325 one  to two q.4 p.r.n. for pain, Robaxin 500 mg 1-2 q.4 p.r.n. for muscle  spasms, Coumadin 3 mg with PT/INR blood draws 2 times a week by Home  Health.  [facility] and we will regulate her Coumadin  levels based on INR reads.      Lianne Cure, P.A.      Nelda Severe, MD  Electronically Signed    MC/MEDQ  D:  06/08/2008  T:  06/08/2008  Job:  [id number]
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
## 2010-06-23 NOTE — Op Note (Signed)
NAME:  Dominique Hughes, Dominique Hughes NO.:  [id number]   MEDICAL RECORD NO.:  [id number]          PATIENT TYPE:  INP   LOCATION:  5040                         FACILITY:  [facility]   PHYSICIAN:  Nelda Severe, MD      DATE OF BIRTH:  1980/07/11   DATE OF PROCEDURE:  06/04/2008  DATE OF DISCHARGE:                               OPERATIVE REPORT   SURGEON:  Nelda Severe, MD   ASSISTANT:  Lianne Cure, PA-C   PREOPERATIVE DIAGNOSIS:  L5-S1 disk rupture (annular tear).   POSTOPERATIVE DIAGNOSIS:  L5-S1 disk rupture (annular tear).   OPERATIVE PROCEDURE:  Total disk arthroplasty, L5-S1 with ProDisc-L (11  degrees, medium footprint, 10 mm) via retroperitoneal approach.   PROCEDURE NOTE:  The patient was positioned supine on a Jackson flat top  table.  The arms were abducted and placed on arm boards.  Hips and knees  were gently flexed and supported on pillows.  Sequential compression  devices were placed on the lower extremities.  A pulse oximeter was  placed on the left great toe.  Foley catheter was placed in the bladder.  The patient was administered with intravenous prophylactic antibiotics,  vancomycin, as well 400 mg of Cipro I.V. for presumptive urinary tract  infection.   Prior to preparation and draping, the C-arm fluoroscopy unit was put in  place for a lateral view.  A long forceps was used to project the line  from the L5-S1 disk anteriorly onto the abdomen and this was marked with  a skin marker, to represent the distal extent necessary for the  incision.   The abdomen was then prepped with DuraPrep and draped in square fashion.  The drapes were secured with Ioban.   A time-out was held at which point the patient's identity, preoperative  diagnosis, intended procedure, etc. were all confirmed and discussed.   A transverse incision was made in the left lower quadrant, just above  the skin mark that was referenced above.  The dissection was carried  down onto the  anterior rectus sheath.  The rectus sheath was cleared of  subcutaneous fat.  A longitudinal incision was made (right angles to the  skin incision) in the rectus sheath.  The medial rectus was identified  and retracted laterally.  We were well below the arcuate line.  The  preperitoneal fat was bluntly dissected around into the retroperitoneum.  The peritoneal sac was mobilized to the right side along with the  ureter, adherent to the posterior aspect of the sac.  The femoral  vessels were identified.  Blunt dissection was then carried out of the  soft tissues within the bifurcation of the great vessels.  Two median  sacral veins were identified.  A table mounted brow retractor was used  to retract the common femoral vessels to the left side.  Further  mobilization with blunt dissection of the right side common iliac  vessels was carried out and they were then retracted to the right side  with a brow retractor.  The brow retractor was then used to retract  proximally just above the level  of the L5-S1 disk and a handheld  malleable was subsequently used distally.  A stainless steel screw was  placed in the disk space at what I had judged to be the center.  AP and  lateral fluoroscopic views were taken.  This confirmed the correct level  and as well showed [place] the screw was slightly too far to the left.  We  then marked the disk with cutting current and the adjacent bone, above  and below, and was judged to be the true center of the disk space.   We then created an annulotomy in rectangular fashion anteriorly and then  separated the disk from the endplates above and below using special  elevators.  This tissue was divided laterally and curetted away from the  posterior annular fibers and most of the disk removed in one piece.   Disk distractor was then placed and we curetted posteriorly to the  posterior part of the vertebra above and below, so that the angled  curette actually entered the  posterior aspect of the disk between the  posterior longitudinal ligament and the bone.  All of the annulus was  excised.  In this case, the posterior longitudinal ligament was not  violated.   We then tried placing a trial 10-mm, medium footprint implant, both 6  degrees and 11 degrees.  Neither would fit adequately because of the  fact that posterior part of the disk was still too tight, constrained by  soft tissue.  We removed more annulus posterolaterally and detached the  posterior longitudinal ligament proximally and distally.  This was then  successful in allowing enough distraction of the disk space posteriorly.  A 11-degree implant fit best and the trial was inserted under biplane  fluoroscopic guidance.  Actually, the dimensions of the endplates were  quite small and we knew that the medium footprint implant (the smallest  available) was going to just fit.  The trial block self-centered and  this corresponded to our determination of where the center was on the  original x-rays etc.   We then further curetted away endplate cartilage.  The 11-degree medium  footprint 10-mm block was again inserted under lateral fluoroscopic  guidance.  We then took the seating chisel and cut slots in the inferior  L5 vertebra and superior S1 vertebra under fluoroscopic guidance.  The  endplates were then impacted under fluoroscopic guidance.  The  polyethylene bearing surface was then inserted with a special inserting  device and seated nicely there being no gap and no step on palpation or  on visual inspection.  Lateral radiograph with the inserting device  removed, it was taken with the fluoroscopy unit and saved.  An AP  fluoroscopic x-ray was also taken and saved.  The device appeared to sit  flush with the anterior aspect of the S1-L5 vertebra.   The retractors were then removed sequentially.  There was no bleeding  from the common iliac vessels.  The ureter was inspected and appeared   intact.  We then closed the anterior rectus sheath using continuous  interrupted #1 Vicryl suture.  Subcutaneous layer was closed using  interrupted 2-0 Vicryl and the skin using a subcuticular 3-0 undyed  Vicryl suture in continuous fashion.  The skin edges were reinforced  with Dermabond and had a Mepilex dressing applied.  Blood loss estimated  by the Anesthesia to be 50 mL.  There were no intraoperative  complications.  Sponge and needle counts were correct.  A flat-plate  of  the abdomen taken in the operating room as a safety precaution, showed  no retained instruments or sponges.   The patient was then awakened, transferred to a bed, and taken to the  recovery room.  In the recovery room, she can actively dorsiflex and  plantar flex her feet and ankles and toes on both sides.  Both dorsalis  pedis pulses are palpable.  At no time did the oxygen saturation monitor  alarmed during the procedure.      Nelda Severe, MD  Electronically Signed     MT/MEDQ  D:  06/04/2008  T:  06/04/2008  Job:  [id number]

## 2010-06-23 NOTE — Assessment & Plan Note (Signed)
Dominique Hughes is a 30 year old single female who is being followed in our  pain and rehabilitative clinic.  Her chief complaint is low thoracic and  lumbar back pain.  She also complains of decreased mobility and  inability to exercise.   Her average pain which is localized to the lower lumbar region is about  a 7 on a scale of 10 interfering significantly with general activity and  enjoyment of life.  Sleep tends to be poor.   Pain is described as constant, sharp, stabbing and aching in nature,  worse with activity.  Pain is worse with walking, bending, sitting.  Standing improves with rest, medication.  She gets fair relief with  current medications that she is provided with through this clinic.   MEDICATIONS:  From our clinic include:   1. Lidoderm on a p.r.n. basis.  2. Ultracet 1 p.o. q.h.s. p.r.n.  3. Xenadrine 4 mg 1 p.o. q.h.s.   At the last visit baseline metabolic panel was obtained to monitor  creatinine and liver function tests.  These were within normal limits  and this was reviewed with her.   Mobility, the patient is able to walk 4-5 minutes at a time.  She is  able to climb stairs.  She does not drive.  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   She is independent with self care and needs increased assistance with  high level household activities including household duties, grocery  shopping.   She denies any problems controlling her bowel or bladder.  Admits to  spasms and trouble walking.  Denies depression, anxiety or suicidal  ideation.   Past medical, social, family history unchanged since last visit.   Patient has had an appointment with Dr. Jeral Fruit last week.  Notes from  this visit are not available.  The patient states Dr. Jeral Fruit ordered  some x-rays and told her to continue to follow-up with our clinic.   PHYSICAL EXAMINATION:  On exam today her blood pressure is 157/91, pulse  94, respirations 18, 100% saturation on room  air.   She is a well-developed, well-nourished female who did not appear in any  distress.   She is oriented x 3.  Speech is clear.  Affect is bright. She is  cooperative and pleasant.  She follows commands without difficulty.   Transitioning from sitting to standing is done with ease.  Gait in the  room is uneven.  She stands with her right leg straight.  Left leg  externally rotated and flexed knee.   Limited lumbar motion is noted not more than 10 degrees, forward flexion  very minimal lumbar extension as well, lateral flexion is very limited,  note more than a few degrees on either side.   Tenderness to palpation in the lumbar paraspinal muscles especially in  the lumbar region noted.   Reflexes 2+ in the upper extremities, slightly more brisk in the lower  extremities without clonus.  No abnormal tone is noted.   Motor strength in general is in the 5/5 range although with hip flexion  bilaterally she has difficulty due to increased back pain during  exertion for testing.  Knee flexors, extensors, dorsiflexors, plantar  flexes, EHL are in the 5/5 range.   Again internal and external rotation of the left hip is without  significant discomfort.  However, on the right 10 degrees of internal  rotation increases her posterior right buttocks pain significantly as  well as  in the back.   IMPRESSION:  1. Chronic low back pain.  2. Thoracolumbar scoliosis.  3. Mild wedge deformity L1/2 per MRI with Schmorl's node.  4. Degenerative facet changes on left at L5/S1.  5. Small central disk protrusion in T7/T8 with normal canal and      parameter per thoracic MRI mild annular care noted at T7, T8 as      well.   PLAN:  Will refill her to [facility] today 4 mg 1 p.o. q.h.s.  Will  refill her Ultracet 1 p.o. q.h.s. p.r.n.  Will set her up for physical  therapy, emphasis on lumbar stabilization within pain free range.  Transitioned to lower extremity conditioning.  May consider some   lightweight work eventually but will hold off on this for now.   Will also have them evaluate her for a heel lift.  She has not obtained  a corset yet due to financial constrains.   Also awaiting radiographs of bilateral hips. Will see her back in a  month.  She has been stable on the above medications.  Has not exhibited  any aberrant behavior.  Takes her medications as prescribed and is  planning to participate in physical rehabilitation.  Will see her back  then in a month.           ______________________________  Brantley Stage, M.D.     DMK/MedQ  D:  03/13/2007 09:30:44  T:  03/13/2007 10:02:09  Job #:  [id number]

## 2010-06-23 NOTE — Assessment & Plan Note (Signed)
HISTORY OF PRESENT ILLNESS:  The patient is a 30 year old woman who is  being seen in our pain and rehabilitation clinic predominantly for low  back pain.  She states her pain is typically localized to the lower  lumbar region and she also has an area on either side of this in the  upper buttock region which bothers her but are different in nature.   In the right side, she describes the pain as fairly constant and a dull  ache almost all of the time.  Centrally she has an area at the  lumbosacral junction which she describes as a constant dull ache and  then in the left upper gluteus central region she describes stabbing  pain which is really dependent upon movement and bending or prolonged  standing.   She does not have really any subjective complaints of sensory loss or  overall motor strength loss.  She does states I have a little balance  problem which she attributes to leg length discrepancy.   Her average pain is about a 7 on a scale of 10.  Sleep tends to be poor.  Pain is typically worse with walking, bending, sitting, standing, and  activity.  Improves with rest and medication.   She reports fair relief with the medications at this time.   MEDICATIONS:  From this clinic include:  1. Lidoderm on a p.r.n. basis.  2. Ultracet one tablet in the evening.  3. Flexeril p.r.n.   She states the Flexeril makes her a little bit dizzy and she does not  like taking it that much.   She is able to walk 4-5 minutes at a time.  She is able to climb stairs.  She is not driving.   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  She  is independent with her self-care, needs some assistance with higher  level household tasks such as meal prep and shopping.  Denies problems  controlling bowel or bladder.  She does check subjective sense of  weakness, however, further discussion reveals that she feels that she  has had an overall loss of endurance rather than specific  weakness.   REVIEW OF SYSTEMS:  Really negative for any problems with her kidneys,  bowels, reproductive organs, bladder.  She does report occasional  constipation which is not a major issue for her at this time.   No other changes in past medical, social, or family history since last  visit.   Radiographs of her thoracic spine were reviewed with her today.  They  were done at [facility], read by Stephani Police, M.D.  Findings show a minimal cervical thoracic scoliosis convexed rightward  and minimal mid thoracic scoliosis convexed leftward, both less than 10  degrees.  Pedicles intact.  No compression fractures.   PHYSICAL EXAMINATION:  VITAL SIGNS:  Blood pressure today is 126/72,  pulse 77, respirations 18, 100% saturated on room air.  GENERAL:  She is a well-developed, well-nourished, female who does not  appear in any distress.  She is oriented x3. Her speech is clear.  Her  affect is bright.  She is alert, cooperative, and pleasant.  She follows  commands without any difficulty.  Transitioning from sit to stand is  done without difficulty.  She does have obvious scoliosis in the  thoracolumbar region.   She has difficulty with forward flexion as well as extension and she is  also limited actually both with right and left lateral flexion  significantly so.   She has some tenderness to palpation around T8 to T10.  Pinprick today  does not reveal any hypersensitivity in thoracic regions, no changes in  the lower extremity.  Sensation is intact.  Her motor strength is good  in both lower extremities without focal deficit.  Reflexes are slightly  brisk in both lower extremities, however, Babinski reaction is downward.  No abnormal tone is noted.  Slightly brisk reflexes in the uppers.   She is able to tandem walk.  She appears to have a little bit of trouble  with this, but is able to maintain her gait pattern.  Romberg's test she  performs adequately.  She does have a  little sway when she performs  this.  Vibratory sensation is intact in the lower extremity.  Position  sense is also intact in the lower extremity.   IMPRESSION:  1. Chronic low back pain, especially at the thoracolumbar junction.  2. Thoracolumbar scoliosis.  3. Slight wedge deformity L1-2 per MRI with Schmorl nodes.  4. Degenerative facet changes on the left at L5-S1.   PLAN:  Per her history, coughing significantly exacerbates her pain.  With her history of trauma in the past and unrelenting thoracolumbar  pain and significant decreased range of motion in her lumbar spine, I  would like to move toward thoracic MRI to rule out any lower thoracic  disk herniation which may be contributing to her overall pain complex.   At this point, I believe that her L5-S1 facet joint may also be giving  her a little trouble, however, her discomfort with coughing, sneezing,  and very little ability to flex forward or extend, I would like to check  and make sure she does not have a thoracic disk issue here.  We will go  ahead and refill her Ultracet today and trial her on some amitriptyline  to see if we can help her sleep at night, 10 mg one p.o. q.h.s. and  Ultracet one p.o. q.h.s. p.r.n. pain.  We will see her back after the  thoracic MRI.           ______________________________  Brantley Stage, M.D.     DMK/MedQ  D:  12/07/2006 10:26:09  T:  12/07/2006 13:12:03  Job #:  [id number]

## 2010-06-23 NOTE — Procedures (Signed)
NAME:  ANISA, LEANOS NO.:  [id number]   MEDICAL RECORD NO.:  [id number]          PATIENT TYPE:  REC   LOCATION:  [facility]                          FACILITY:  [facility]   PHYSICIAN:  Erick Colace, M.D.DATE OF BIRTH:  1980/07/19   DATE OF PROCEDURE:  DATE OF DISCHARGE:                               OPERATIVE REPORT   PROCEDURE:  Left-sided L5 dorsal ramus injection, left L4 medial branch  under fluoroscopic guidance.   INDICATION:  Chronic low back pain with pain only partially responsive  to medication management.  Diagnosis of lumbar facet syndrome as well as  lumbago.  Her pain does interfere with self-care and mobility including  meal prep, household duties and shopping.   Informed consent was obtained after describing risks and benefits of  procedure with the patient.  These include bleeding, bruising,  infection.  She elects to proceed and has given written consent.  The  patient placed prone on fluoroscopy table.  Betadine prep, sterile  drape.  A 25-gauge inch and a half needle was used to anesthetize the  skin and subcu tissue, 1% lidocaine x2 mL.   Then, a needle was inserted under fluoroscopic guidance targeting the  left S1 SAP sacral ala junction, bone contact made, confirmed with  lateral imaging.  Omnipaque 180 x0.5 mL demonstrated no intravascular  uptake and 0.5 mL of solution containing 1 mL of 2% MPF lidocaine and  0.5 mL of 40 mg/mL dexamethasone.  Then, the left L5 SAP transverse  process junction targeted, bone contact made, confirmed with lateral  imaging.  Omnipaque 180 x0.5 mL demonstrated no intravascular uptake.  Then, 0.5 mL of dexamethasone lidocaine solution was injected in the  left L4 SAP transverse junction targeted, bone contact made, confirmed  with lateral imaging.  Omnipaque 180 x0.5 mL demonstrated no  intravascular uptake.  Then, 0.5 mL of dexamethasone lidocaine solution  was injected.  The patient tolerated the procedure  well.  Postinjection  vitals stable.  Postinjection instructions given.  Preinjection pain  level 8/10 and postinjection 6/10.  Follow up with Dr. Pamelia Hoit.      Erick Colace, M.D.  Electronically Signed     AEK/MEDQ  D:  01/23/2008 10:41:20  T:  01/23/2008 23:09:13  Job:  [id number]

## 2010-06-23 NOTE — Assessment & Plan Note (Signed)
Dominique Hughes is a 30 year old single female who is followed in our [facility] for chronic low back pain.  She was last seen  by me on November 24, 2007.  In the interim, she has undergone an L5-S1  medial branch block.  She reports that she had about an hour and half  relief of pain in the left buttock to the upper left posterior thigh  region.   Currently, her pain is about 7 on a scale of 10, again localized to the  bilateral low back region worse on the left than on the right and  through the left buttock region as well, not as much pain going down in  the lower leg.  Pain is described as constant, stabbing, aching, and  sharp in nature.  Sleep tends to be poor.  Pain is fairly constant,  worse with walking, bending, sitting, standing, however, improves with  rest and medication.  She gets fair relief with current meds, which are  prescribed through this clinic.   MEDICATIONS:  Provided through this clinic include;  1. Ultracet 1 p.o. nightly.  2. Tizanidine 4 mg nightly.   She reports no other changes in her past medical, social, or family  history since her last visit.   FUNCTIONAL STATUS:  She is able to walk about 4-5 minutes.  At times,  she is able to climb stairs.  She does not drive.  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  She is independent with feeding, bathing,  dressing, and toileting.  She requires some assistance with higher level  and heavier household tasks.   She denies problems controlling bowel or bladder.  She does admit to  back spasm.   No other changes in past medical history or no changes in review of  systems.   Social and family history are also unchanged from last visit.   PHYSICAL EXAMINATION:  VITAL SIGNS:  Blood pressure is 128/81, pulse 71,  respiration 18, and 99% saturate on room air.  GENERAL:  She is a well-developed, well-nourished woman who does not  appear in any distress.  She is oriented x3.  Speech is  clear.  Affect  is bright.  She is alert, cooperative, and pleasant.  Follows commands  without any difficulty.   Cranial nerves are grossly intact.  Coordination is intact.  Reflexes  are slightly brisk in the lower extremities.  No clonus is appreciated.  No abnormal tone is noted.   Sensation is intact to light touch and pinprick in both lower  extremities.  Motor strength is 5/5 in right lower extremity.  Left  lower extremity is 5/5 with the exception of left hip flexors.  She has  trouble giving full effort due to pain in her back when she does this.   Her gait is asymmetric.  She has decreased stance phase on the left,  otherwise, her gait is fairly symmetric in stride length, and she has  normal base of support during her gait cycle as well.  She has no  weakness noted with foot drop or dragging of her toes.   She has some difficulty with tandem gait.  Romberg test is performed  adequately.   Tenderness is noted in the left especially at the left lumbosacral  junction.   She has significantly decreased range of motion in all directions  including forward flexion, extension, lateral flexion, not more than 5  degrees in either of these directions are  of note.   IMPRESSION:  1. Chronic low back pain, scoliosis convex to the right with mild      wedge deformity at L1-2.  2. Degenerative facet changes on the left at L5-S1.  3. History of disk protrusion at multiple levels including L2-3,      mildly at L3-L4, and L4-5.   PLAN:  Continue tizanidine 4 mg nightly and Ultracet 1 p.o. nightly as  well.  She has 2 refills left.   We will also have her set up for medial branch block at L4-5 and L5-S1  with Dr. Wynn Banker.  She did report overall relief for about an hour and  half with last injection.  She would like to pursue this to see if  medial branch blocks moving toward radiofrequency may give her some  relief.   She states that she will be following back up at some point  with Dr.  Noel Gerold for her yearly checkup.  I will see her back otherwise in a month.           ______________________________  Brantley Stage, M.D.     DMK/MedQ  D:  12/25/2007 10:15:24  T:  12/26/2007 00:23:37  Job #:  [id number]

## 2010-06-23 NOTE — Assessment & Plan Note (Signed)
Dominique Hughes is a 30 year old single female who is followed in our [facility] for low back pain.  She is known to have a  scoliosis convexed to the right and degenerative facet changes on the  left at L5-S1 as well as some discal problems as well.   She has been participating in a physical therapy program to improve her  overall activity level.  She recently obtained an orthotic, which she  has found has given her some increased pain down the back of her leg  after using it for about three weeks.   She states her overall pain is about a 7 on a scale of 10.  Pain is  described as sharp, stabbing, constant, aching in nature.  Worsens with  walking, bending, sitting, standing.  Improves with rest and  medications.  She is currently getting fair relief from current  medications from this clinic.   MEDICATIONS FROM OUR CLINIC:  1. Ultracet 1 p.o. nightly.  2. Lidoderm on a p.r.n. basis.  3. Tizanidine 4 mg 1 p.o. nightly as well.   She is able to walk about four to five minutes at a time.  She is able  to climb stairs.  She does not drive.  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  She is independent with her self care,  needs assistance with higher level household activities.   Denies problems controlling bowel or bladder.  Admits to lumbar spasms,  trouble walking due to low back pain.  Otherwise no changes in past  medical, social or family history.   She states she has followed up with Dr. Murray Hodgkins for IME.   EXAMINATION:  Her blood pressure is 140/90, pulse 89, respirations 18,  100% saturated on room air.  She is a well developed, well nourished  woman who does not appear in any distress.  She is oriented x3.  Speech is clear.  Affect is bright.  She is alert,  cooperative and pleasant.  She follows commands without difficulty.  Transitioning from sitting to standing is done a bit slowly today.  Her  gait in the room appears much more  symmetric than it did at the last  visit.  She has significant limitations in lumbar motion with respect to  flexion and extension, not much more than 5 to 7 degrees in any  direction.  She has some tenderness in the lumbar paraspinal musculature  both right and left in the lumbar region.  Reflexes are 2+ at the patella tendons and Achilles tendons.  No  abnormal tone is noted.  No clonus is noted.  Motor strength is difficult to test due to increased back pain with hip  flexion and knee extension, however distally dorsiflexors, plantarflexes  in the 5/5 range.   IMPRESSION:  1. Chronic low back pain.  2. Scoliosis convexed to the right.  3. Mild wedge deformity L1-L2 per MRI.  4. Degenerative facet changes on left at L5-S1.  5. History of small central disk protrusion T7-8 and C5-6.  6. Significant decreased range of motion in the hip on the right.   PLAN:  We will have her discontinue use of the orthotic for a couple of  weeks until her left leg hamstring area calms down a bit, and then  instead of having her use it continually, we will have her start slowly  using it starting out with maybe an hour a day and increasing it each  week a little bit more.  She is quite interested in pursuing some type  of aerobic training to help keep her weight at a reasonable level.  However, finding an exercise that she can do repetitively that will not  hurt her back or hip region may be a challenge.  I will give a call over  to her therapist today to discuss options with her.  She has tried a  bicycle and this does seem to bother her after a while.  May consider  some type of water therapy.  We will discuss further with her therapist.   Would like her to continue working on some truncal strengthening  exercises prior to engaging in that type of activity.   She has discussed possibly pursing sacroiliac joint injections or facet  injections, and I believe that this is probably a good option for  her to  consider.  She does have some facet changes on the left at L5-S1.  She  has not been particularly interested in pursuing any type of invasive  interventions.  However, this certainly may be of some benefit to her,  and we discussed this at length today as well.   She does not need refills on any of her medications today.  We also  discussed trialing some other types of medications.  She is really not  interested in escalating her medications at this time.  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  Her  biggest goal at this point is to be able to do a little more after work  in terms of activity and she would like to be able to maintain her  weight at a reasonable level as well.  I will see her back in a month.           ______________________________  Brantley Stage, M.D.     DMK/MedQ  D:  05/10/2007 12:23:54  T:  05/10/2007 13:59:57  Job #:  [id number]
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
## 2010-06-23 NOTE — H&P (Signed)
NAME:  Dominique Hughes, BUITRON NO.:  [id number]   MEDICAL RECORD NO.:  [id number]          PATIENT TYPE:  INP   LOCATION:  NA                           FACILITY:  [facility]   PHYSICIAN:  Nelda Severe, MD      DATE OF BIRTH:  12/07/1980   DATE OF ADMISSION:  DATE OF DISCHARGE:                              HISTORY & PHYSICAL   This young lady is under the care of Dr. Nelda Severe.   CHIEF COMPLAINT:  Lumbar pain, left posterior thigh pain to mid back.   DRUG ALLERGIES:  PENICILLIN and AMOXICILLIN.   CURRENT MEDICATIONS:  1. Ultram p.r.n. for pain.  2. Tizanidine at bedtime.  3. Ortho-Tri-Cyclen daily.  4. Adderall daily.  5. Metoprolol b.i.d.   MEDICAL HISTORY:  1. Migraine.  2. Asthma.  3. Back pain.  4. Hypertension.   FAMILY HISTORY:  Hypertension, diabetes, cancer, and stroke.   REVIEW OF SYSTEMS:  She reports no bowel or bladder incontinence.  No  fever.  No chills.  No chronic cough.  No hemoptysis.  No melena.   PHYSICAL EXAMINATION:  GENERAL APPEARANCE:  Alert and oriented x3.  HEENT:  She is normocephalic.  Pupils are equal, round and reactive to  light.  NECK:  Supple.  Full range of motion.  CHEST:  Clear to auscultation.  No wheezing noted.  HEART:  Regular rate and rhythm.  No murmurs noted.  ABDOMEN:  Soft, nontender to palpation.  Positive bowel sounds  auscultated.  EXTREMITIES:  She is neurovascularly motor intact, bilateral lower  extremities.  She has tenderness to palpation L5-S1.  Posterior lumbar  spine.  She also has pain that radiates posterior thigh on the left to  the mid back area.   DIAGNOSIS:  L5-S1 herniated disk syndrome.   OPERATIVE PLAN:  Prodisc replacement by Dr. Nelda Severe L5-S1 anterior  approach.      Lianne Cure, P.A.      Nelda Severe, MD  Electronically Signed    MC/MEDQ  D:  05/31/2008  T:  06/01/2008  Job:  [id number]

## 2010-06-23 NOTE — Assessment & Plan Note (Signed)
[name] is a 30 year old woman, who is followed in our [facility] for chronic low back, and posterior buttock  pain.   Within the last couple of months, she is followed up with Dr. Sharolyn Douglas.  He ordered a diskogram on her which apparently was positive at L5-S1 and  L4-5.  This is per her report.  I do not have any notes her regarding  this report currently.   She followed back up with Dr. Noel Gerold after the visit and was referred to  Dr. Alveda Reasons for evaluation for possible disk replacement.  She is  currently in the process of getting an one more opinion regarding  options for her with respect to treatment of diskogenic back pain.   She continues to have pain that is averaging about 7 on a scale of 10,  limits her ability to walk, sit, or stand for any length of time.  Pain  is worse with activities in general and improves with rest.   MEDICATIONS:  She reports overall poor sleep.  Pain is basically  localized to the low lumbar region, sharp, stabbing, constant, and  aching in nature.  She would able to walk about 45 minutes at a time.  She is able to climb stairs.  She does not drive.   Medications from this clinic to treat her low back pain include Ultracet  1 p.o. nightly and tizanidine 4 mg nightly.   FUNCTIONAL STATUS:  She works for about 6 hours a week, doing Advice worker.  She is independent with self care and needs assistance with  heavier household tasks.   Denies depression and anxiety.  Denies harm to self or others.  Denies  problems controlling bowel or bladder.   REVIEW OF SYSTEMS:  Otherwise, negative.   No changes in past medical, social or family history since last visit.   Bone scan, which was ordered in December completed on January 29, 2008,  was reviewed with her today.  There was no significant abnormalities  visible on the bone scan read by Dr. Francene Boyers, lumbar scoliosis and  pelvis tilt were noted.  No evidence of  osteoid osteoma was appreciated.   PHYSICAL EXAMINATION:  VITAL SIGNS:  Today blood pressure is 131/85,  pulse 73, respiration 18, and 99% saturated on room air.  GENERAL:  She is a well-developed and well-nourished woman, who appears  her stated age, and does not appear in any distress.  NEUROLOGIC:  She is oriented x3.  Speech is clear.  Affect is bright.  She is alert, cooperative, and pleasant.  She follows commands without  difficulty and answers questions appropriately.   EXTREMITIES:  Cranial nerves and coordination are grossly intact.  Her  reflexes are slightly brisk without clonus in the lower extremities.  Motor strength is 5/5 in the lower extremities.  She has difficulty  giving full effort because of increased back pain with hip flexion and  knee extension bilaterally.   She reports no sensory deficits or light touch in the lower extremities.   She has full strength at the ankles 5/5 with dorsiflexors as well as EHL  bilaterally.   She transitions from sitting-to-standing without difficulty.  She has  slight scoliosis and thoracal lumbar region.  She has limitation of  motion in all planes, not more than about 5-10 degrees in any plane ,  complaints of pain throughout these maneuvers.   Some tenderness is noted with palpation in the lower lumbar  paraspinal  musculature.   IMPRESSION:  1. Chronic low back pain and scoliosis, which is convex to the right      with mild wedge deformity at one L2.  2. Degenerative facet changes at L5-S1.  3. History of disk protrusion at multiple levels L2,-3, L3-4, and L4-      5.   New diagnosis of diskogenic pain at L4-5 and L5-S1 per patient report  from diskogram.   PLAN:  The medications were reviewed with her today.  She does not have  any problems with the Ultracet over the tizanidine at this time.  She  does not need a refill on these medications.   She plans to follow up with Dr. Jeral Fruit for a second opinion regarding   spine surgery.  I will see her back in April.           ______________________________  Dominique Hughes, M.D.     DMK/MedQ  D:  04/08/2008 09:18:15  T:  04/08/2008 04:54:09  Job #:  [id number]

## 2010-06-23 NOTE — Assessment & Plan Note (Signed)
[name] is a 30 year old single female who is followed in our  [facility] for chronic low back pain.  She is known  to have scoliosis over the last couple of years, and a lumbar MRI with  and without contrast was ordered last month to evaluate what seemed to  be progressive scoliosis.  The MRI of the lumbar spine with and without  contrast, which was done on August 02, 2007, read by Dominique Hughes, results are  attached to the chart.  Briefly, there is a stable left lateral recess  stenosis at L4-5, stable mild left lateral disk protrusions in the upper  lumbar spine without definite root compression, stable chronic L1 and L2  superior endplate deformities, and stable mild circumferential disk  protrusion at T12 and L1 without stenosis.  The results of this scan  were reviewed with her, and I answered all of her questions regarding  these results.   Her average pain is around a 7 or an 8 on a scale of 10 and interfering  significantly with activity levels, worse with walking, bending,  sitting, standing, and inactivity, improves with rest and medications.  Pain is described as sharp, constant, aching, and stabbing in nature.  The pain increases to a higher intensity as she stands longer.   Functional status is as follows.  She can walk about 4 to 5 minutes at a  time.  She is able to climb stairs and she currently is not driving.  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   Admits to some lower extremity weakness and trouble walking due to pain,  has been on nonsteroidal antiinflammatory medications, and has developed  some abdominal pain over the last couple of weeks and has discontinued  her Naprosyn 2 weeks ago because of this.   Past medical, social, and family history are otherwise unchanged.   Medications provided by this clinic include:  1. Ultracet  1 p.o. at bedtime.  2. Tizanidine 4 mg at bedtime.  3. Naprosyn 500 mg 1 p.o. b.i.d., she discontinued 2 weeks ago because      of abdominal discomfort.   PHYSICAL EXAMINATION:  Today, blood pressure is 144/88, pulse 104,  respirations 22, and 97% saturated on room air.  She is a well-  developed, well-nourished female who does not appear in any distress.  She is oriented x3.  Her speech is clear.  Her affect is bright.  She is  alert, cooperative, and pleasant.  She follows commands without  difficulty.   Cranial nerves are grossly intact with the exception of the left eye,  she does have some visual-field deficits.  Coordination is grossly  intact.  Reflexes are 2+ at the patellar tendons, 1+ at the Achilles  tendons.  There is no clonus noted.  Denies sensory abnormalities with  respect to light touch and pinprick in the lower extremities today.  Musculoskeletal exam is notable for obvious thoracolumbar scoliosis.  She has significant tenderness in the paraspinal musculature in the  thoracolumbar region, limited motion in all planes, not more than about  5 to 10 degrees in any direction is noted.   IMPRESSION:  1. Chronic low back pain, scoliosis convex to the right, mild wedge      deformity at L1-2.  2. Degenerative facet changes on the left at L5-S1.  3. History of small central  disk protrusion at T7-8 and C5-6.   PLAN:  Her MRI with and without contrast was reviewed with her today.  She is interested at this point in pursuing injections into her  sacroiliac joints and possibly considering medial branch block and  moving towards RF if she gets some relief with medial branch block.  We  will discuss her case further with Dr. Wynn Hughes and attempt to get her  set up for, to start with, sacroiliac joint injections.  She does not  need refills on her medications today.  She has been  stable on the Ultracet and tizanidine without any side effects.  The  Naprosyn was discontinued  2 weeks ago since she has had some abdominal  complaints from it.  We will see her back in a month.           ______________________________  Dominique Hughes, M.D.     DMK/MedQ  D:  09/01/2007 12:33:41  T:  09/02/2007 01:33:37  Job #:  [id number]

## 2010-06-24 NOTE — Op Note (Signed)
NAME:  Dominique Hughes, Dominique Hughes NO.:  [id number]  MEDICAL RECORD NO.:  [id number]           PATIENT TYPE:  I  LOCATION:  9103                          FACILITY:  [facility]  PHYSICIAN:  Carrington Clamp, M.D. DATE OF BIRTH:  [birth year]  DATE OF PROCEDURE:  05/19/2010 DATE OF DISCHARGE:                              OPERATIVE REPORT   PREOPERATIVE DIAGNOSIS:  Primary cesarean section secondary to multiple back surgeries precluding epidural placement and pushing during labor.  POSTOPERATIVE DIAGNOSIS:  Primary cesarean section secondary to multiple back surgeries precluding epidural placement and pushing during labor.  PROCEDURE:  Primary low-transverse cesarean section.  SURGEON:  Carrington Clamp, MD  ASSISTANT:  Leilani Able, PA-C  ANESTHESIA:  Spinal.  FINDINGS:  Boy, vertex presentation, Apgars 8 and 9, weight 8 pounds 11 ounces.  Normal tubes, ovaries and uterus seen.  Specimens were none. ViaCord collection was obtained.  BLOOD LOSS:  850 mL.  IV FLUIDS:  2000 mL.  URINE OUTPUT:  200 mL.  MEDICATIONS:  Cipro, clindamycin and Pitocin.  Counts were correct x3.  TECHNIQUE:  After adequate spinal anesthesia was achieved, the patient was prepped and draped in usual sterile fashion in dorsal supine position with leftward tilt.  A Pfannenstiel skin incision was made with the scalpel and carried down to the fascia with bipolar cautery.  No monopolar was used during this procedure.  Scalpel was used also to incise the fascia in the midline.  The fascia was extended in transverse curvilinear manner with the Mayo scissors and the fascia was reflected from the rectus muscles superiorly and inferiorly with Mayo scissors. Bowel free portion of peritoneum was entered into bluntly after the rectus muscle have been split in the midline.  The peritoneum was then extended superior and inferior manner with good visualization of the bowel and bladder.  The Alexis  instrument was then placed and the vesicouterine fascia tented up and incised in transverse curvilinear manner.  The uterus was incised in the upper portion of the lower uterine segment until the amnion could be seen.  The uterine incision was extended in transverse curvilinear manner bluntly.  The amnion was ruptured, clear fluid was noted and baby was delivered without complication.  The baby was bulb suctioned.  The cord was clamped and cut and baby was handed to awaiting pediatrics.  Cord blood collection was then obtained and the placenta was then delivered manually.  The uterus was cleared of all debris with wet lap and the uterine incision was closed with a running lock stitch of 0 Monocryl.  A second layer of Monocryl was used to do an imbricating layer.  Hemostasis was achieved.  The ovaries and tubes were checked and found to be normal.  All instruments were then withdrawn from the abdomen.  The peritoneum was closed with running stitch of 2-0 Vicryl.  This incorporated the rectus muscles as separate layer.  The fascia was then closed with running stitch of 0 Vicryl.  The subcutaneous layer was closed, was rendered hemostatic with the bipolar cautery and irrigation.  The subcutaneous layer was then closed with interrupted stitches of 2-0 plain  gut.  The skin was closed with staples.  The patient tolerated the procedure well.  She returned to recovery room in stable condition.     Carrington Clamp, M.D.     MH/MEDQ  D:  05/19/2010  T:  05/19/2010  Job:  [id number]  Electronically Signed by Carrington Clamp MD on 06/24/2010 02:54:35 PM
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
## 2010-07-03 NOTE — Discharge Summary (Signed)
  NAME:  Dominique Hughes, Dominique Hughes NO.:  [id number]  MEDICAL RECORD NO.:  [id number]           PATIENT TYPE:  I  LOCATION:  9103                          FACILITY:  [facility]  PHYSICIAN:  Ilda Mori, M.D.   DATE OF BIRTH:  Jun 13, 1980  DATE OF ADMISSION:  05/19/2010 DATE OF DISCHARGE:  05/22/2010                              DISCHARGE SUMMARY   FINAL DIAGNOSIS:  Primary cesarean section for history of back surgeries. Healthy female infant.  PROCEDURES:  Primary low transverse cesarean section.  COMPLICATIONS:  None.  CONDITION ON DISCHARGE:  Improved.  This is a 30 year old primigravida female at 76 weeks' gestation who was admitted by Dr. Carrington Clamp for primary cesarean section due to a history of back surgeries which was felt would interfere with the placement of adequate epidural anesthesia and adequate second stage pushing efforts.  The patient's prenatal course was otherwise uncomplicated.  She was brought to the operating room by Dr. Henderson Cloud on the day admission where a primary low transverse cesarean section was performed without complication.  A female infant weighing 8 pounds 11 ounces was delivered and without difficulty, and the tubes, ovaries and uterus all appeared normal on evaluation after surgery.  The patient's postoperative course was benign without significant fever or anemia.  On the third postoperative day, the patient was ready for discharge.  She was discharged on a regular diet.  She was told to limit her activities. She was told to resume her predelivery medications which included Zyrtec, OxyContin, hydrocodone, methocarbamol, metoprolol tartrate, and Percocet.  She was asked to return to the office in 3 weeks for follow up evaluation.  Her final laboratory data showed that her postoperative hemoglobin was 8.1 grams with a white count 9600, platelet count of 159.     Ilda Mori, M.D.     RK/MEDQ  D:  06/29/2010  T:  06/30/2010   Job:  [id number]  Electronically Signed by Ilda Mori M.D. on 07/03/2010 09:41:10 PM

## 2010-08-09 ENCOUNTER — Inpatient Hospital Stay (HOSPITAL_COMMUNITY)
Admission: AD | Admit: 2010-08-09 | Payer: PRIVATE HEALTH INSURANCE | Source: Ambulatory Visit | Admitting: Obstetrics & Gynecology

## 2012-08-08 ENCOUNTER — Other Ambulatory Visit: Payer: Self-pay | Admitting: Gastroenterology

## 2012-08-08 DIAGNOSIS — R1013 Epigastric pain: Secondary | ICD-10-CM

## 2012-08-16 ENCOUNTER — Ambulatory Visit
Admission: RE | Admit: 2012-08-16 | Discharge: 2012-08-16 | Disposition: A | Discharge status: Home / Self Care | Payer: BC Managed Care – PPO | Source: Ambulatory Visit | Attending: Gastroenterology | Admitting: Gastroenterology

## 2012-08-16 DIAGNOSIS — R1013 Epigastric pain: Secondary | ICD-10-CM

## 2012-08-18 ENCOUNTER — Ambulatory Visit
Admission: RE | Admit: 2012-08-18 | Discharge: 2012-08-18 | Disposition: A | Discharge status: Home / Self Care | Payer: BC Managed Care – PPO | Source: Ambulatory Visit | Attending: Gastroenterology | Admitting: Gastroenterology

## 2012-08-18 ENCOUNTER — Other Ambulatory Visit: Payer: Self-pay | Admitting: Gastroenterology

## 2012-08-18 DIAGNOSIS — R1013 Epigastric pain: Secondary | ICD-10-CM

## 2012-08-21 ENCOUNTER — Other Ambulatory Visit (HOSPITAL_COMMUNITY): Payer: Self-pay | Admitting: Gastroenterology

## 2012-08-21 DIAGNOSIS — R109 Unspecified abdominal pain: Secondary | ICD-10-CM

## 2012-08-22 ENCOUNTER — Encounter (HOSPITAL_COMMUNITY)
Admission: RE | Admit: 2012-08-22 | Discharge: 2012-08-22 | Disposition: A | Discharge status: Home / Self Care | Payer: BC Managed Care – PPO | Source: Ambulatory Visit | Attending: Gastroenterology | Admitting: Gastroenterology

## 2012-08-22 DIAGNOSIS — K838 Other specified diseases of biliary tract: Secondary | ICD-10-CM | POA: Insufficient documentation

## 2012-08-22 DIAGNOSIS — R109 Unspecified abdominal pain: Secondary | ICD-10-CM

## 2012-08-22 MED ORDER — TECHNETIUM TC 99M MEBROFENIN IV KIT
5.0000 | PACK | Freq: Once | INTRAVENOUS | Status: AC | PRN
  Administered 2012-08-22: 5 via INTRAVENOUS

## 2012-08-31 ENCOUNTER — Ambulatory Visit (INDEPENDENT_AMBULATORY_CARE_PROVIDER_SITE_OTHER): Payer: BC Managed Care – PPO | Admitting: General Surgery

## 2012-08-31 ENCOUNTER — Encounter (INDEPENDENT_AMBULATORY_CARE_PROVIDER_SITE_OTHER): Payer: Self-pay | Admitting: General Surgery

## 2012-08-31 VITALS — BP 140/80 | HR 68 | Temp 99.8°F | Resp 15 | Ht 68.0 in | Wt 211.4 lb

## 2012-08-31 DIAGNOSIS — G8929 Other chronic pain: Secondary | ICD-10-CM

## 2012-08-31 DIAGNOSIS — R1013 Epigastric pain: Secondary | ICD-10-CM

## 2012-08-31 NOTE — Progress Notes (Addendum)
Patient ID: Dominique Hughes, female   DOB: [birth year], 32 y.o.   MRN: [id number]  Chief Complaint  Patient presents with  . New Evaluation    Eval hiatal hernia    HPI Dominique Hughes is a 32 y.o. female.  This patient is referred by Dominique Hughes for evaluation of epigastric pain and left upper quadrant pain which has been present for the last month. She has a history of regurgitation and a Nissen fundoplication as a child and she was 15 or 16 and she has done well since her procedure up until about one month ago when she started having epigastric pain and this pain under her left.. She says that she also has acid in the back of her throat and has been coughing she says it really relates that she can you is in semireclined positions but is worse with Lynco leaflet and leaning forward. She says that she has had a 20 pound intentional weight loss. She does have occasional chest pain which previously had been improved with TUMS and she attributed this to reflux. She does have a history of chronic back pain and she does have a back stimulator and takes pain medication for this. She does have associated nausea and always feels full with associated burping and bloating and has a hard time taking down any food without worsening her symptoms. She says that she has been on Dexilant for the last 2-3 weeks without any improvement in her symptoms and she says that she has had some fevers as well. She also has been constipated more recently. She does take occasional aspirin for her back but doesn't take much NSAIDs chronically HPI  Past Medical History  Diagnosis Date  . Anemia   . GERD (gastroesophageal reflux disease)   . Hypertension     Past Surgical History  Procedure Laterality Date  . Cesarean section    . Nissen fundoplication    . Shoulder surgery Right   . Spine surgery      L5/S1  . Spinal cord stimulator insertion    . Tonsillectomy    . Diaphragm surgery      Family History  Problem  Relation Age of Onset  . Cancer Father     skin  . Cancer Maternal Grandmother     breast  . Cancer Paternal Grandmother     lung    Social History History  Substance Use Topics  . Smoking status: Never Smoker   . Smokeless tobacco: Never Used  . Alcohol Use: No    Allergies  Allergen Reactions  . Amoxicillin   . Penicillins     Current Outpatient Prescriptions  Medication Sig Dispense Refill  . aspirin 200 MG suppository Place 200 mg rectally every 6 (six) hours as needed for fever.      . Cholecalciferol (VITAMIN D3) 400 UNIT/ML LIQD Take by mouth.      . ergocalciferol (VITAMIN D2) 50000 UNITS capsule Take 50,000 Units by mouth once a week.      . magnesium 30 MG tablet Take 30 mg by mouth 2 (two) times daily.      . methocarbamol (ROBAXIN) 500 MG tablet Take 500 mg by mouth 4 (four) times daily.      . ADDERALL XR 20 MG 24 hr capsule       . DEXILANT 60 MG capsule       . Hydrocodone-APAP-Dietary Prod (HYDROCODONE-APAP-NUTRIT SUPP) 10-325 MG MISC        No current  facility-administered medications for this visit.    Review of Systems Review of Systems All other review of systems negative or noncontributory except as stated in the HPI  Blood pressure 140/80, pulse 68, temperature 99.8 F (37.7 C), temperature source Temporal, resp. rate 15, height 5\' 8"  (1.727 m), weight 211 lb 6.4 oz (95.89 kg), last menstrual period 07/25/2012.  Physical Exam Physical Exam Physical Exam  Nursing note and vitals reviewed. Constitutional: She is oriented to person, place, and time. She appears well-developed and well-nourished. No distress.  HENT:  Head: Normocephalic and atraumatic.  Mouth/Throat: No oropharyngeal exudate.  Eyes: Conjunctivae and EOM are normal. Pupils are equal, round, and reactive to light. Right eye exhibits no discharge. Left eye exhibits no discharge. No scleral icterus.  Neck: Normal range of motion. Neck supple. No tracheal deviation present.   Cardiovascular: Normal rate, regular rhythm, normal heart sounds and intact distal pulses.   Pulmonary/Chest: Effort normal and breath sounds normal. No stridor. No respiratory distress. She has no wheezes.  Abdominal: Soft. Bowel sounds are normal. She exhibits no distension and no mass. There is some epigastric tenderness. There is no rebound and no guarding. WHSS Musculoskeletal: Normal range of motion. She exhibits no edema and no tenderness.  Neurological: She is alert and oriented to person, place, and time.  Skin: Skin is warm and dry. No rash noted. She is not diaphoretic. No erythema. No pallor.  Psychiatric: She has a normal mood and affect. Her behavior is normal. Judgment and thought content normal.    Data Reviewed UGI, [place], HIDA  Assessment    Epigastric abdominal pain This is certainly a tough problem without a clear-cut answer at this time. She has a history of a Nissen fundoplication and though her symptoms sound like they are due to reflux she does not get any improvement with PPI's.  I suspect that this is most likely gastritis or peptic ulcer disease given her current symptoms mainly of pain however you would expect some improvement with the PPIs. This could also be due to gastroparesis. I have reviewed her upper GI with the radiologist and after further review of the pictures, we are not convinced that this is even truly a paraesophageal hernia. Certainly it is small if it is present. I am not certain that this would be causing this degree of symptoms. She does not have symptoms classic for gallbladder disease and her workup of this has been negative to date. I think that upper endoscopy would be helpful to evaluate her Nissen and to evaluate for possible peptic ulcer disease or gastritis or bile reflux and for possible paraesophageal hernia. I also think that gastric emptying study to be beneficial and we will set her up for this. We will also test for H. Pylori at the time of  her EGD.    Plan    We will start with EGD and gastric imaging studies and I will discuss the findings with her gastroenterologist. If these tests fail to demonstrate any obvious cause of that I would proceed with esophageal manometry and pH probe testing       Asma Boldon DAVID 08/31/2012, 9:01 AM   I called Dominique Hughes and discussed her case.  He agreed to evaluate with EGD, for PUD, evaluate her wrap, and to evaluate for paraesophageal hernia.

## 2012-09-06 ENCOUNTER — Other Ambulatory Visit: Payer: Self-pay | Admitting: Gastroenterology

## 2012-09-13 ENCOUNTER — Encounter (HOSPITAL_COMMUNITY)
Admission: RE | Admit: 2012-09-13 | Discharge: 2012-09-13 | Disposition: A | Discharge status: Home / Self Care | Payer: BC Managed Care – PPO | Source: Ambulatory Visit | Attending: General Surgery | Admitting: General Surgery

## 2012-09-13 DIAGNOSIS — R142 Eructation: Secondary | ICD-10-CM | POA: Insufficient documentation

## 2012-09-13 DIAGNOSIS — R141 Gas pain: Secondary | ICD-10-CM | POA: Insufficient documentation

## 2012-09-13 DIAGNOSIS — G8929 Other chronic pain: Secondary | ICD-10-CM

## 2012-09-13 DIAGNOSIS — R1013 Epigastric pain: Secondary | ICD-10-CM | POA: Insufficient documentation

## 2012-09-13 MED ORDER — TECHNETIUM TC 99M SULFUR COLLOID
2.1000 | Freq: Once | INTRAVENOUS | Status: AC | PRN
  Administered 2012-09-13: 2.1 via INTRAVENOUS

## 2012-09-14 ENCOUNTER — Telehealth (INDEPENDENT_AMBULATORY_CARE_PROVIDER_SITE_OTHER): Payer: Self-pay | Admitting: General Surgery

## 2012-09-14 ENCOUNTER — Encounter (INDEPENDENT_AMBULATORY_CARE_PROVIDER_SITE_OTHER): Payer: Self-pay | Admitting: General Surgery

## 2012-09-14 ENCOUNTER — Ambulatory Visit (INDEPENDENT_AMBULATORY_CARE_PROVIDER_SITE_OTHER): Payer: BC Managed Care – PPO | Admitting: General Surgery

## 2012-09-14 VITALS — BP 136/86 | HR 82 | Temp 97.1°F | Resp 16 | Ht 68.0 in | Wt 213.4 lb

## 2012-09-14 DIAGNOSIS — R109 Unspecified abdominal pain: Secondary | ICD-10-CM

## 2012-09-14 NOTE — Telephone Encounter (Signed)
Spoke with pt and informed her that we have set up her CT at [facility] located at [address] on 09/15/12 with an arrival time of 1:45.  Instructed her that she cant have any solid foods 4 hours before the test and that she must drink the first bottle of contrast at 12:00 and the second at 1:00.

## 2012-09-14 NOTE — Progress Notes (Signed)
Subjective:     Patient ID: Dominique Hughes, female   DOB: Jul 11, 1980, 32 y.o.   MRN: [id number]  HPI This patient follows up for left upper quadrant abdominal pain and acid reflux. I sent her for HIDA scan and gastric imaging study and both of these were essentially negative. She did not have any reproduction of her symptoms with the hida scan.  She also had upper endoscopy by her gastroenterologist Dr. Evette Cristal. I do not have the report available to me currently but she says that he felt that the wrap was intact but she possibly had a small gastric diverticulum. Otherwise she really hasn't had much change in her symptoms. She remains on her PPI and has not had any significant improvement in her symptoms. She says that she has been having fevers daily as well as diarrhea and other GI symptoms. But she continues to have her left upper quadrant pain which radiates around her side and under her ribs  Review of Systems     Objective:   Physical Exam NAD, nontoxic     Assessment:     Abdominal pain I am really not sure what is causing her symptoms. If her upper endoscopy is truly normal, but I really do not know what else to offer her. I have recommended pH probe and manometry which might help to evaluate her Nissen fundoplication. I think that this would be required prior to any surgery regardless. However, I am not certain that any surgery is really required at this time since her upper GI to was really not impressive for a hiatal hernia and am not sure that evening she did have a hiatal hernia, that it would be causing this kind of symptoms.  This is a difficult problem because she does seem to be fairly uncomfortable. She also tells me that she has been having fevers even up to 102 daily. Have recommended a CT scan of the abdomen to evaluate for any other obvious intra-abdominal pathology given her significant abdominal pain and fevers and lack of other cause. If her CT scan is normal, I would  recommended that she get pH probe and manometry. I have also discussed with her possible referral to Dr. Carolynn Sayers at [facility] for another opinion with regard to her symptoms and possible revision of her Nissen    Plan:     We will check a CT scan of the abdomen and set her up for pH probe and manometry to and depending on results of these, we will consider referral to [facility] and Dr. Carolynn Sayers   Addendum: I have seen the report from EGD and it looks essentially normal.  Esophagus is normal without reflux changes.  There was question of small paraesophageal hernia vs. Gastric diverticulum.  It doesn't sound like this would be causing significant symptoms as she is having.  Will do CT and PH, manometry.

## 2012-09-15 ENCOUNTER — Ambulatory Visit
Admission: RE | Admit: 2012-09-15 | Discharge: 2012-09-15 | Disposition: A | Discharge status: Home / Self Care | Payer: BC Managed Care – PPO | Source: Ambulatory Visit | Attending: General Surgery | Admitting: General Surgery

## 2012-09-15 DIAGNOSIS — R109 Unspecified abdominal pain: Secondary | ICD-10-CM

## 2012-09-15 MED ORDER — IOHEXOL 300 MG/ML  SOLN
125.0000 mL | Freq: Once | INTRAMUSCULAR | Status: AC | PRN
  Administered 2012-09-15: 125 mL via INTRAVENOUS

## 2012-09-15 NOTE — Telephone Encounter (Signed)
Spoke with pt and informed her that I got her an appt to see Dr. Evette Cristal on 10/10/12 with an arrival time of 9:00.

## 2012-09-27 ENCOUNTER — Encounter (INDEPENDENT_AMBULATORY_CARE_PROVIDER_SITE_OTHER): Payer: Self-pay

## 2012-10-10 ENCOUNTER — Ambulatory Visit (INDEPENDENT_AMBULATORY_CARE_PROVIDER_SITE_OTHER): Payer: BC Managed Care – PPO | Admitting: Internal Medicine

## 2012-10-10 ENCOUNTER — Encounter: Payer: Self-pay | Admitting: Internal Medicine

## 2012-10-10 VITALS — BP 153/104 | HR 94 | Temp 98.8°F | Ht 68.0 in | Wt 216.0 lb

## 2012-10-10 DIAGNOSIS — R509 Fever, unspecified: Secondary | ICD-10-CM

## 2012-10-10 DIAGNOSIS — Z87898 Personal history of other specified conditions: Secondary | ICD-10-CM

## 2012-10-10 LAB — CBC WITH DIFFERENTIAL/PLATELET
Basophils Relative: 1 % (ref 0–1)
Eosinophils Absolute: 0.2 10*3/uL (ref 0.0–0.7)
Eosinophils Relative: 3 % (ref 0–5)
Hemoglobin: 13 g/dL (ref 12.0–15.0)
Lymphs Abs: 1.8 10*3/uL (ref 0.7–4.0)
MCH: 27.8 pg (ref 26.0–34.0)
MCHC: 33.6 g/dL (ref 30.0–36.0)
MCV: 82.9 fL (ref 78.0–100.0)
Monocytes Absolute: 0.5 10*3/uL (ref 0.1–1.0)
Monocytes Relative: 7 % (ref 3–12)
Neutrophils Relative %: 64 % (ref 43–77)
RBC: 4.67 MIL/uL (ref 3.87–5.11)

## 2012-10-10 LAB — C-REACTIVE PROTEIN: CRP: <0.5 mg/dL (ref <0.60)

## 2012-10-10 LAB — COMPREHENSIVE METABOLIC PANEL
ALT: 20 U/L (ref 0–35)
AST: 16 U/L (ref 0–37)
CO2: 24 mEq/L (ref 19–32)
Chloride: 106 mEq/L (ref 96–112)
Creat: 0.59 mg/dL (ref 0.50–1.10)
Sodium: 138 mEq/L (ref 135–145)
Total Bilirubin: 0.3 mg/dL (ref 0.3–1.2)
Total Protein: 6.6 g/dL (ref 6.0–8.3)

## 2012-10-10 NOTE — Progress Notes (Signed)
Patient ID: Dominique Hughes, female   DOB: [birth year], 32 y.o.   MRN: [id number]         [facility]  Reason for Consult: Fever of unknown origin Referring Physician: Dr. Claudell Hughes  Patient Active Problem List   Diagnosis Date Noted  . H/O abdominal pain 10/10/2012  . Fever of unknown origin (FUO)     Patient's Medications  New Prescriptions   No medications on file  Previous Medications   ADDERALL XR 20 MG 24 HR CAPSULE       ASPIRIN 325 MG EC TABLET    Take 325 mg by mouth every 4 (four) hours as needed for pain.   CHOLECALCIFEROL (VITAMIN D3) 400 UNIT/ML LIQD    Take by mouth.   DEXILANT 60 MG CAPSULE       ERGOCALCIFEROL (VITAMIN D2) 50000 UNITS CAPSULE    Take 50,000 Units by mouth once a week.   HYDROCODONE-APAP-DIETARY PROD (HYDROCODONE-APAP-NUTRIT SUPP) 10-325 MG MISC       MAGNESIUM 30 MG TABLET    Take 30 mg by mouth 2 (two) times a week.    METHOCARBAMOL (ROBAXIN) 500 MG TABLET    Take 500 mg by mouth 4 (four) times daily.  Modified Medications   No medications on file  Discontinued Medications   ASPIRIN 200 MG SUPPOSITORY    Place 200 mg rectally every 6 (six) hours as needed for fever.    Recommendations: 1. Continue observation off of antibiotics for now 2. Check CBC with differential, complete metabolic panel, sedimentation rate, C-reactive protein and blood cultures 3. Continue daily temperature log 4. Stool for culture and C. difficile PCR if diarrhea recurs 5. Followup in 2 weeks   Assessment: The cause of her fever of unknown origin is unclear at this time. It appears that the blood work she has had done has not shown any clues and there were no clear abnormalities found during her evaluation of her abdominal pain. Her exam today is unremarkable. There is no evidence of any vertebral infection infection or infection of her nerve stimulator seen on her CT scan. Have asked her to continue her daily temperature log and I will  repeat blood work today and obtain blood cultures and see her back in 2 weeks. I cannot determine if her son's illness is related to her illness. He is scheduled to see a pediatric infectious disease physician at [facility] tomorrow.  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 She was in her usual state of health until early July when she began to develop daily fevers associated with epigastric discomfort. She was seen by her gastroenterologist, Dr. Wandalee Hughes. She recalls that her liver enzymes were below normal. She underwent an ultrasound which showed some fatty infiltration of the liver and a dilated common bile duct. She had a normal HIDA scan and normal gastric emptying scan. A CT scan of the abdomen and pelvis and lower lungs did not reveal any acute abnormalities. She underwent upper endoscopy which also did not reveal any acute abnormalities. She seemed to get a little bit better in early August only to have the abdominal pain and fevers return. She was evaluated at the [facility] student clinic where a CBC was normal. Her 68-year-old son recently had a similar illness and a positive Monospot test. Her HIV serology showed a negative IgM antibody and a positive IgG antibody compatible with  remote, in active infection. She's not received any empiric antibiotics recently. Her temperature is in early July were up to 103. More recently she has been around 101 nearly every day. She is also noted anorexia with about a 15 pound weight loss that is unintentional. Early in her illness she also had some tenderness in her left axillary line and some sore throat. She's also noted some intermittent itching and some very tiny bumps on her skin. She has chronic back pain and has had previous surgery at the L5-S1 level. She's not had any recent travel or other significant exposures that she knows of although she has been shadowing some physicians  during hospital rounds. She lives with her fianc and 51-year-old son. Her fianc is in good health.  Review of Systems: Constitutional: positive for anorexia, fevers, malaise and weight loss, negative for chills and sweats Eyes: negative Ears, nose, mouth, throat, and face: negative Respiratory: positive for occasional cough to clear my throat recently, negative for asthma, chronic bronchitis, dyspnea on exertion, hemoptysis and pleurisy/chest pain Cardiovascular: negative Gastrointestinal: positive for abdominal pain, change in bowel habits, diarrhea, dyspepsia and nausea, negative for vomiting Genitourinary:negative Integument/breast: positive for pruritus and rash, negative for dryness and skin color change Hematologic/lymphatic: negative Behavioral/Psych: negative    Past Medical History  Diagnosis Date  . Anemia   . GERD (gastroesophageal reflux disease)   . Hypertension   . Fever of unknown origin (FUO)   . Attention deficit disorder (ADD)     History  Substance Use Topics  . Smoking status: Never Smoker   . Smokeless tobacco: Never Used  . Alcohol Use: No    Family History  Problem Relation Age of Onset  . Cancer Father     skin  . Cancer Maternal Grandmother     breast  . Cancer Paternal Grandmother     lung   Allergies  Allergen Reactions  . Amoxicillin   . Penicillins     OBJECTIVE: Blood pressure 153/104, pulse 94, temperature 98.8 F (37.1 C), temperature source Oral, height 5\' 8"  (1.727 m), weight 216 lb (97.977 kg), last menstrual period 09/28/2012. General: She appears healthy and in no distress Skin: No rash Oral: No oropharyngeal lesions. Her teeth are in good condition Lymph nodes: No palpable adenopathy Eyes: Normal external exam Neck: Supple Lungs:  clear Cor: Regular S1 and S2 and no murmurs Abdomen: Mild epigastric tenderness. Quiet bowel sounds. No palpable masses. Joints and extremities: No acute abnormalities. Her nerve stimulator is  palpable over her right lower back. It is nontender and there are no other signs of acute infection.  Microbiology: No results found for this or any previous visit (from the past 240 hour(s)).  Cliffton Asters, MD [facility] Disease [facility] [phone] pager   [phone] cell 10/10/2012, 2:23 PM

## 2012-10-31 ENCOUNTER — Ambulatory Visit (INDEPENDENT_AMBULATORY_CARE_PROVIDER_SITE_OTHER): Payer: BC Managed Care – PPO | Admitting: Internal Medicine

## 2012-10-31 ENCOUNTER — Encounter: Payer: Self-pay | Admitting: Internal Medicine

## 2012-10-31 VITALS — BP 155/96 | HR 94 | Temp 99.4°F | Ht 68.0 in | Wt 217.2 lb

## 2012-10-31 DIAGNOSIS — Z23 Encounter for immunization: Secondary | ICD-10-CM

## 2012-10-31 DIAGNOSIS — R509 Fever, unspecified: Secondary | ICD-10-CM

## 2012-10-31 NOTE — Progress Notes (Signed)
Patient ID: [name], female   DOB: 03-Jul-1980, 32 y.o.   MRN: [id number]         Dominique Hughes  Patient Active Problem List   Diagnosis Date Noted  . H/O abdominal pain 10/10/2012  . Fever of unknown origin (FUO)     Patient's Medications  New Prescriptions   No medications on file  Previous Medications   ADDERALL XR 20 MG 24 HR CAPSULE       ASPIRIN 325 MG EC TABLET    Take 325 mg by mouth every 4 (four) hours as needed for pain.   CHOLECALCIFEROL (VITAMIN D3) 400 UNIT/ML LIQD    Take by mouth.   DEXILANT 60 MG CAPSULE       ERGOCALCIFEROL (VITAMIN D2) 50000 UNITS CAPSULE    Take 50,000 Units by mouth once a week.   HYDROCODONE-APAP-DIETARY PROD (HYDROCODONE-APAP-NUTRIT SUPP) 10-325 MG MISC       MAGNESIUM 30 MG TABLET    Take 30 mg by mouth 2 (two) times a week.    METHOCARBAMOL (ROBAXIN) 500 MG TABLET    Take 500 mg by mouth 4 (four) times daily.  Modified Medications   No medications on file  Discontinued Medications   No medications on file    Subjective: Dominique Hughes is in for her routine followup visit. Since her initial visit last month she had one bout of diarrhea which resolved spontaneously. She's not had any further fevers that she is aware of. She is feeling back to normal and able to do all of her school work and other responsibilities.  Review of Systems: Constitutional: negative Eyes: negative Ears, nose, mouth, throat, and face: negative Respiratory: negative Cardiovascular: negative Gastrointestinal: negative Genitourinary:negative  Past Medical History  Diagnosis Date  . Anemia   . GERD (gastroesophageal reflux Hughes)   . Hypertension   . Fever of unknown origin (FUO)   . Attention deficit disorder (ADD)     History  Substance Use Topics  . Smoking status: Never Smoker   . Smokeless tobacco: Never Used  . Alcohol Use: No    Family History  Problem Relation Age of Onset  . Cancer Father     skin  .  Cancer Maternal Grandmother     breast  . Cancer Paternal Grandmother     lung    Allergies  Allergen Reactions  . Amoxicillin   . Penicillins     Objective: Temp: 99.4 F (37.4 C) (09/23 0927) Temp src: Oral (09/23 0927) BP: 155/96 mmHg (09/23 0927) Pulse Rate: 94 (09/23 0927)  General: She is in good spirits. Her weight is up 1 pound to 217 Skin: No rash Oral: No oropharyngeal lesions Lungs: Clear Cor: Regular S1 and S2 no murmurs Abdomen: Soft and nontender  Lab Results  Component Value Date   WBC 7.2 10/10/2012   HGB 13.0 10/10/2012   HCT 38.7 10/10/2012   MCV 82.9 10/10/2012   PLT 353 10/10/2012   BMET    Component Value Date/Time   NA 138 10/10/2012 1108   K 4.1 10/10/2012 1108   CL 106 10/10/2012 1108   CO2 24 10/10/2012 1108   GLUCOSE 91 10/10/2012 1108   BUN 8 10/10/2012 1108   CREATININE 0.59 10/10/2012 1108   CREATININE 0.49 06/06/2008 0500   CALCIUM 9.3 10/10/2012 1108   GFRNONAA >60 06/06/2008 0500   GFRAA  Value: >60        The eGFR has been calculated using the MDRD  equation. This calculation has not been validated in all clinical situations. eGFR's persistently <60 mL/min signify possible Chronic Kidney Hughes. 06/06/2008 0500   Lab Results  Component Value Date   ALT 20 10/10/2012   AST 16 10/10/2012   ALKPHOS 62 10/10/2012   BILITOT 0.3 10/10/2012   Lab Results  Component Value Date   CRP <0.5 10/10/2012   Lab Results  Component Value Date   ESRSEDRATE 1 10/10/2012   Blood cultures: Negative  Assessment: Her low-grade fevers have resolved spontaneously. There is no evidence of any ongoing inflammation or infection.  Plan: 1. No further workup is indicated at this time 2. Followup here as needed   Cliffton Asters, MD [facility] [phone] pager   [phone] cell 10/31/2012, 9:45 AM

## 2013-12-18 IMAGING — CT CT ABD-PELV W/ CM
2 of 4 series · 17 of 46 positions shown, 19 images · IV contrast (READICAT/WATER & [ID] OMNI [id number])
Comparison: Ultrasound, 08/18/2012

CLINICAL DATA: Left upper quadrant and epigastric pain for 4 weeks.
Nausea, diarrhea and fever.  History of a fundoplication and back
surgery.

CT ABDOMEN AND PELVIS WITH CONTRAST
TECHNIQUE: Multidetector CT imaging of the abdomen and pelvis was
performed following the standard protocol during bolus
administration of intravenous contrast.
Contrast: 125mL OMNIPAQUE IOHEXOL 300 MG/ML  SOLN

[Series 2: abd/pelvis with · axial · 0.84mm/px · z∈[-425,-20]mm · 14 of 89 slices shown, 16 images]
[im 4/89  soft-tissue]
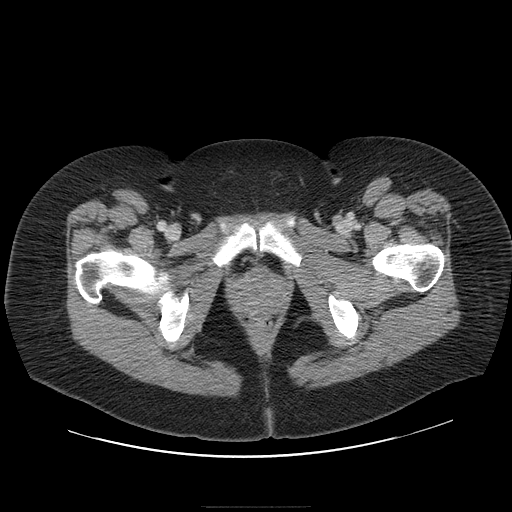
[im 4/89  bone]
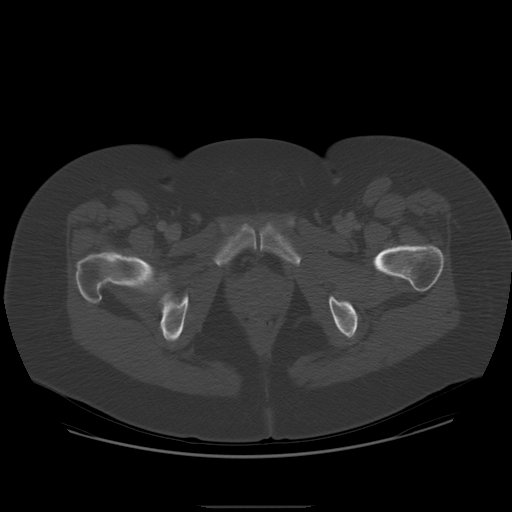
[im 11/89  soft-tissue]
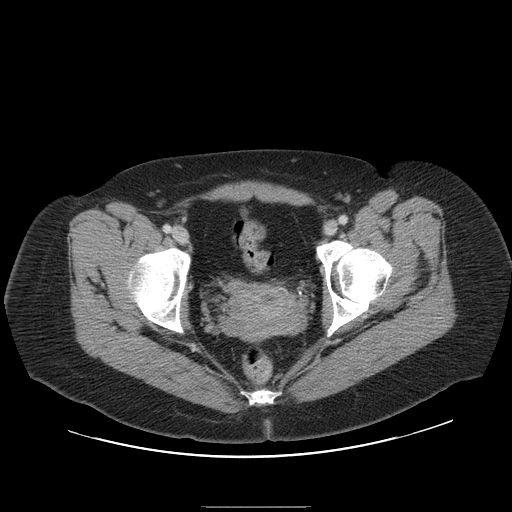
[im 18/89  soft-tissue]
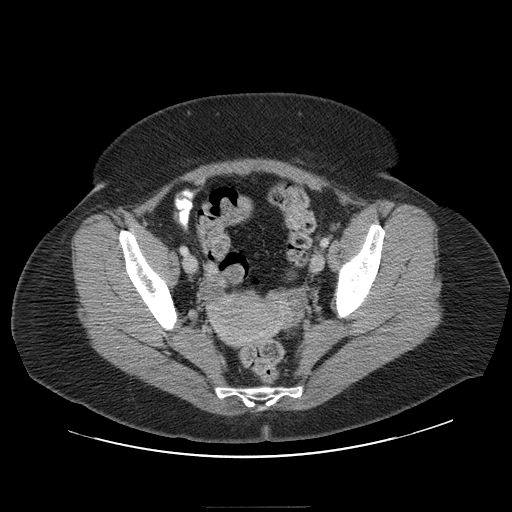
[im 25/89  soft-tissue]
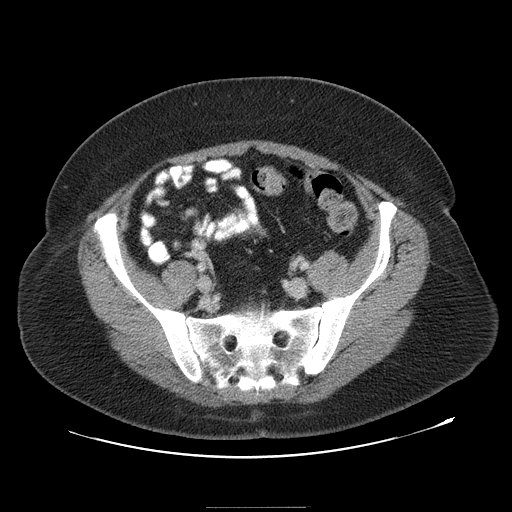
[im 29/89  soft-tissue]
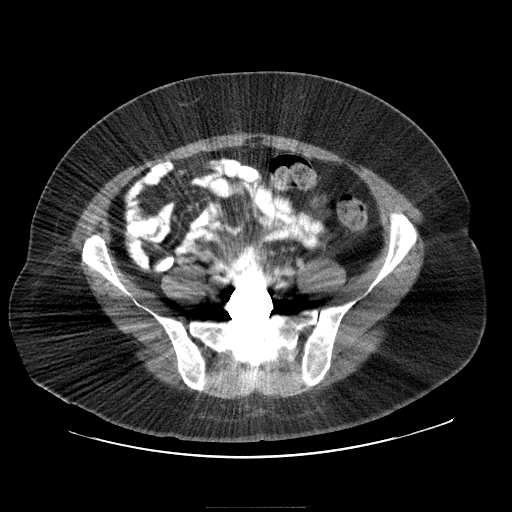
[im 36/89  soft-tissue]
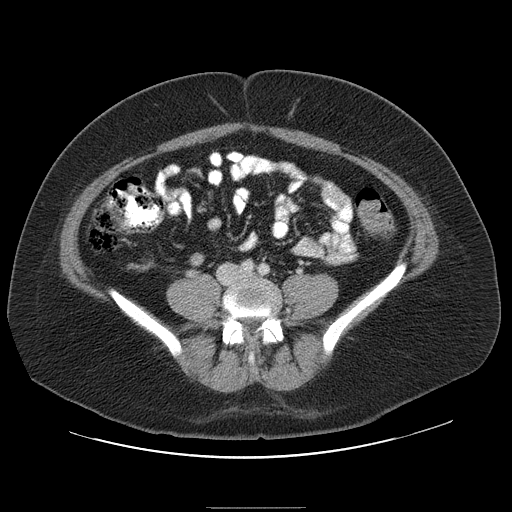
[im 43/89  soft-tissue]
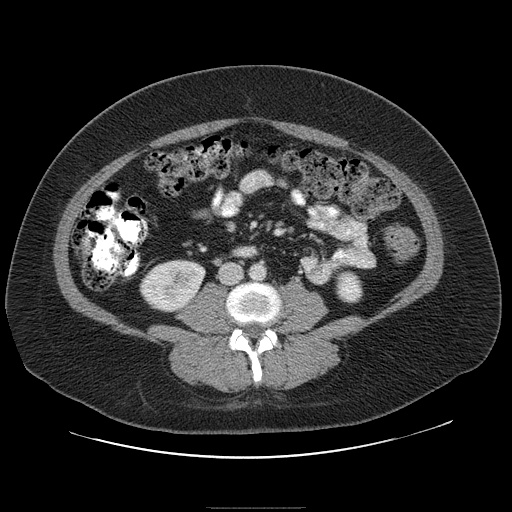
[im 46/89  soft-tissue]
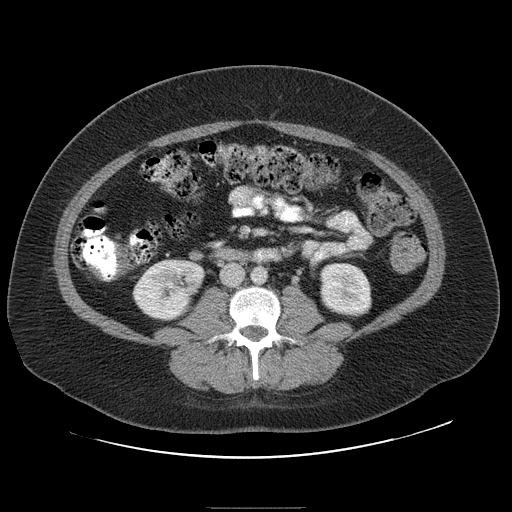
[im 53/89  soft-tissue]
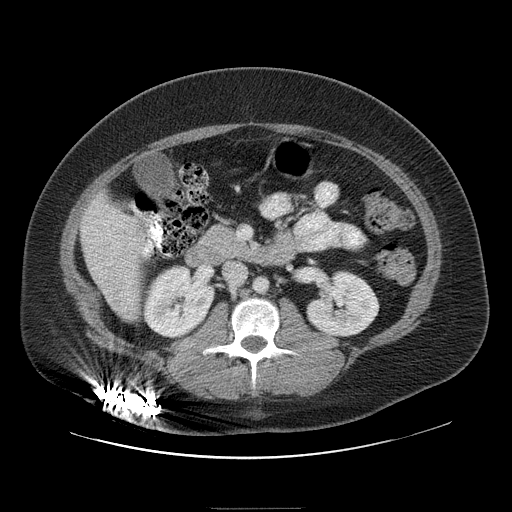
[im 53/89  bone]
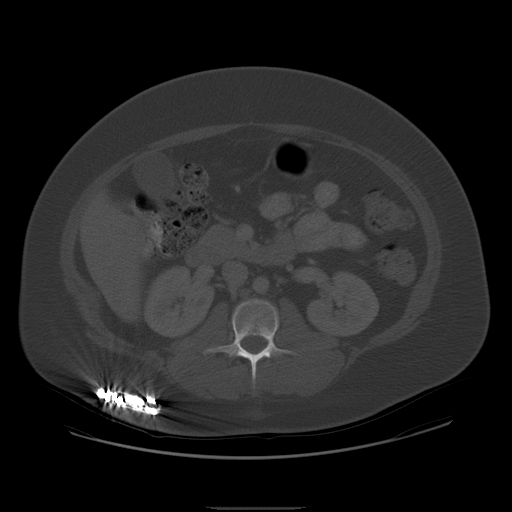
[im 60/89  soft-tissue]
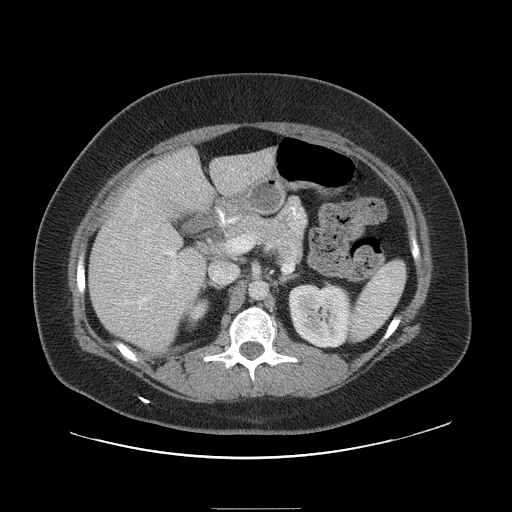
[im 67/89  soft-tissue]
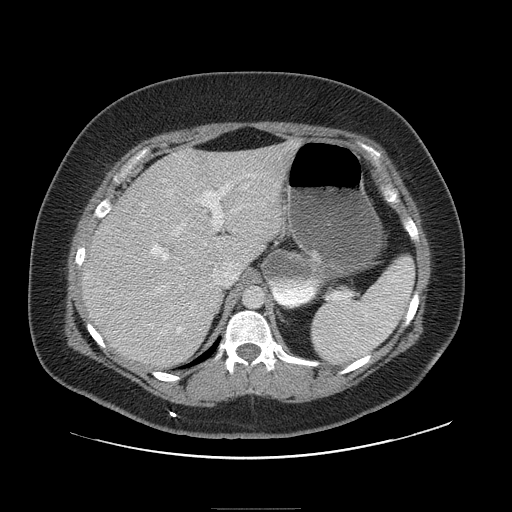
[im 71/89  soft-tissue]
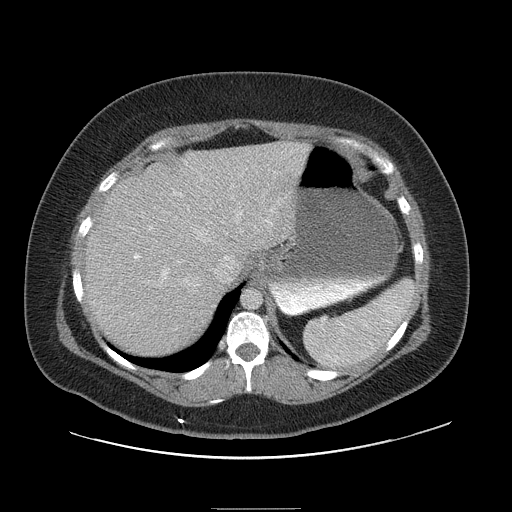
[im 78/89  soft-tissue]
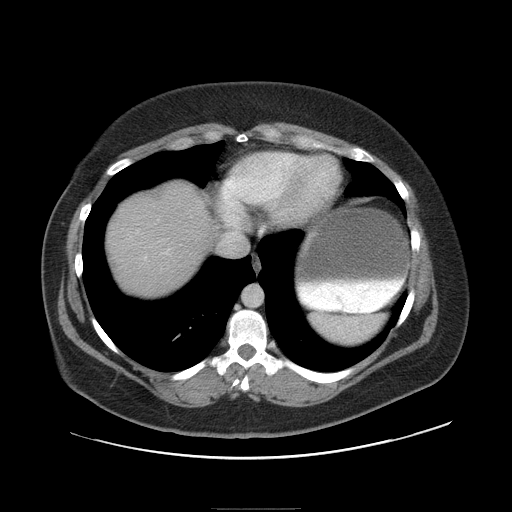
[im 85/89  soft-tissue]
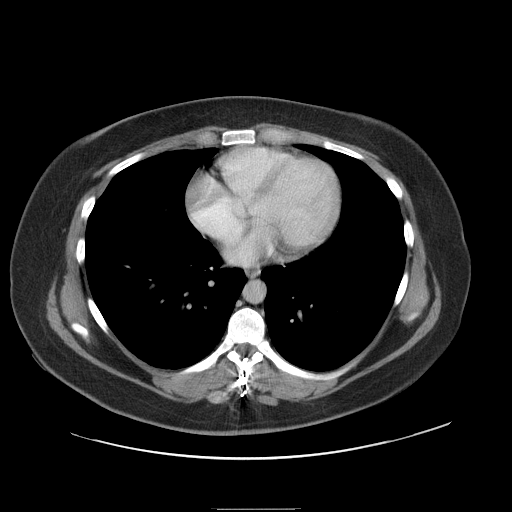

[Series 400: coronal · coronal · 0.99mm/px · 3 of 138 slices shown]
[im 46/138  soft-tissue]
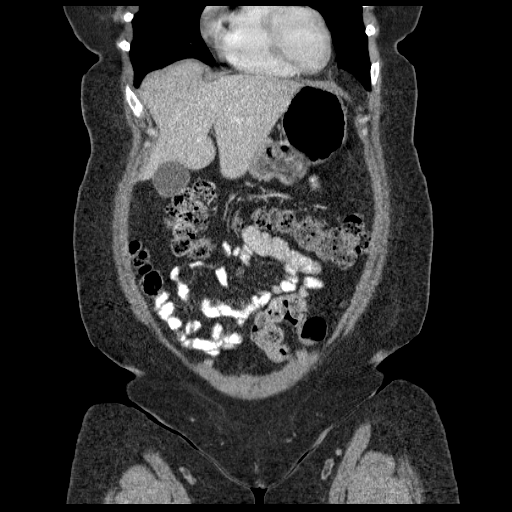
[im 61/138  soft-tissue]
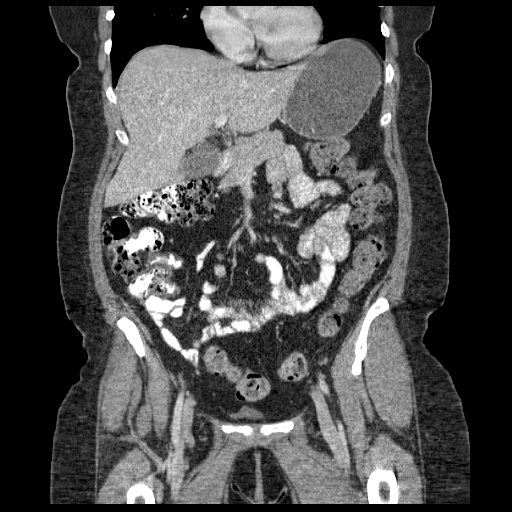
[im 77/138  soft-tissue]
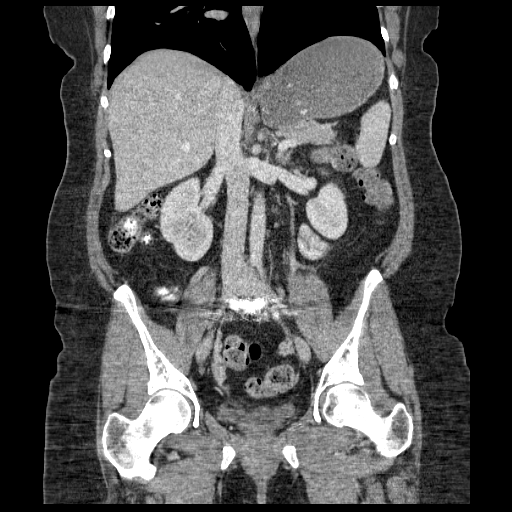

[17 of 46 positions shown; findings below may reference images not displayed]

FINDINGS: Clear lung bases.  The heart is normal in size. Surgical
vascular clips lie at the gastroesophageal junction consistent with
the given history of a prior fundoplication.

Normal liver, spleen, gallbladder and pancreas.  No bile duct
dilation.  No adrenal masses.

Normal kidneys, ureters and bladder.

The uterus and adnexa are unremarkable.

No adenopathy.  There are no abnormal fluid collections.

There is increase stool throughout the colon.  There is no bowel
wall thickening or inflammatory change.  The small bowel is
unremarkable.  A normal appendix is visualized.

There is a metal disc spacer at L5-S1.  Small Schmorl's nodes are
noted along the upper endplates of L1-L2.  A spine stimulator
device lies in the thoracic spinal canal.
IMPRESSION: No acute findings.

Increase stool is noted throughout the colon, but there is no bowel
inflammation.  A normal appendix is visualized.

Changes from L5-S1 lumbar surgery and a previous fundoplication.

No other abnormalities..

## 2016-06-02 ENCOUNTER — Other Ambulatory Visit: Payer: Self-pay | Admitting: Obstetrics and Gynecology

## 2016-06-04 LAB — CYTOLOGY - PAP

## 2016-08-13 ENCOUNTER — Other Ambulatory Visit (HOSPITAL_COMMUNITY): Payer: Self-pay | Admitting: Obstetrics and Gynecology

## 2016-08-13 DIAGNOSIS — I998 Other disorder of circulatory system: Secondary | ICD-10-CM

## 2016-08-15 ENCOUNTER — Encounter (HOSPITAL_COMMUNITY)
Admission: AD | Disposition: A | Discharge status: Home / Self Care | Payer: Self-pay | Source: Ambulatory Visit | Attending: Obstetrics and Gynecology

## 2016-08-15 SURGERY — DILATION AND CURETTAGE
Anesthesia: General

## 2016-08-16 ENCOUNTER — Encounter (HOSPITAL_COMMUNITY): Payer: Self-pay | Admitting: *Deleted

## 2016-08-16 ENCOUNTER — Other Ambulatory Visit (HOSPITAL_COMMUNITY): Payer: Self-pay

## 2016-08-16 ENCOUNTER — Observation Stay (HOSPITAL_COMMUNITY)
Admission: AD | Admit: 2016-08-16 | Discharge: 2016-08-17 | Disposition: A | Discharge status: Home / Self Care | Payer: BLUE CROSS/BLUE SHIELD | Source: Ambulatory Visit | Attending: Obstetrics and Gynecology | Admitting: Obstetrics and Gynecology

## 2016-08-16 ENCOUNTER — Encounter (HOSPITAL_COMMUNITY)
Admission: AD | Disposition: A | Discharge status: Home / Self Care | Payer: Self-pay | Source: Ambulatory Visit | Attending: Obstetrics and Gynecology

## 2016-08-16 ENCOUNTER — Encounter (HOSPITAL_COMMUNITY): Payer: Self-pay

## 2016-08-16 DIAGNOSIS — O039 Complete or unspecified spontaneous abortion without complication: Secondary | ICD-10-CM

## 2016-08-16 DIAGNOSIS — Z880 Allergy status to penicillin: Secondary | ICD-10-CM | POA: Diagnosis not present

## 2016-08-16 DIAGNOSIS — R58 Hemorrhage, not elsewhere classified: Secondary | ICD-10-CM | POA: Diagnosis present

## 2016-08-16 DIAGNOSIS — I10 Essential (primary) hypertension: Secondary | ICD-10-CM | POA: Insufficient documentation

## 2016-08-16 DIAGNOSIS — O034 Incomplete spontaneous abortion without complication: Secondary | ICD-10-CM | POA: Diagnosis present

## 2016-08-16 HISTORY — PX: DILATION AND CURETTAGE OF UTERUS: SHX78

## 2016-08-16 SURGERY — DILATION AND CURETTAGE
Anesthesia: Choice

## 2016-08-16 MED ORDER — SODIUM CHLORIDE 0.9 % IV SOLN
Freq: Once | INTRAVENOUS | Status: AC
  Administered 2016-08-17: via INTRAVENOUS

## 2016-08-16 MED ORDER — LACTATED RINGERS IV SOLN
INTRAVENOUS | Status: DC
  Administered 2016-08-16 – 2016-08-17 (×2): via INTRAVENOUS

## 2016-08-16 MED ORDER — FENTANYL CITRATE (PF) 100 MCG/2ML IJ SOLN
INTRAMUSCULAR | Status: AC
  Filled 2016-08-16: qty 4

## 2016-08-16 MED ORDER — METHYLERGONOVINE MALEATE 0.2 MG/ML IJ SOLN
INTRAMUSCULAR | Status: AC
  Filled 2016-08-16: qty 1

## 2016-08-16 MED ORDER — CARBOPROST TROMETHAMINE 250 MCG/ML IM SOLN
INTRAMUSCULAR | Status: AC
  Filled 2016-08-16: qty 1

## 2016-08-16 MED ORDER — MISOPROSTOL 200 MCG PO TABS
ORAL_TABLET | ORAL | Status: AC
  Filled 2016-08-16: qty 5

## 2016-08-16 MED ORDER — FENTANYL CITRATE (PF) 100 MCG/2ML IJ SOLN
100.0000 ug | INTRAMUSCULAR | Status: DC | PRN
  Administered 2016-08-16: 100 ug via INTRAVENOUS
  Filled 2016-08-16: qty 2

## 2016-08-16 MED ORDER — OXYTOCIN 40 UNITS IN LACTATED RINGERS INFUSION - SIMPLE MED
INTRAVENOUS | Status: AC
  Filled 2016-08-16: qty 1000

## 2016-08-16 SURGICAL SUPPLY — 13 items
CATH ROBINSON RED A/P 16FR (CATHETERS) ×2 IMPLANT
CLOTH BEACON ORANGE TIMEOUT ST (SAFETY) ×2 IMPLANT
DILATOR CANAL MILEX (MISCELLANEOUS) IMPLANT
GLOVE BIO SURGEON STRL SZ7 (GLOVE) ×2 IMPLANT
GLOVE BIOGEL PI IND STRL 7.0 (GLOVE) ×1 IMPLANT
GLOVE BIOGEL PI INDICATOR 7.0 (GLOVE) ×1
GOWN STRL REUS W/TWL LRG LVL3 (GOWN DISPOSABLE) ×4 IMPLANT
PACK VAGINAL MINOR WOMEN LF (CUSTOM PROCEDURE TRAY) ×2 IMPLANT
PAD OB MATERNITY 4.3X12.25 (PERSONAL CARE ITEMS) ×2 IMPLANT
PAD PREP 24X48 CUFFED NSTRL (MISCELLANEOUS) ×2 IMPLANT
SET BERKELEY SUCTION TUBING (SUCTIONS) ×2 IMPLANT
TOWEL OR 17X24 6PK STRL BLUE (TOWEL DISPOSABLE) ×4 IMPLANT
VACURETTE 10 RIGID CVD (CANNULA) ×2 IMPLANT

## 2016-08-16 NOTE — MAU Note (Signed)
Pt started with low back pain and low abd cramping since 1930 with sm amt of vag bleeding and worsening markedly. Denies. She called MD and was told to come in for pain relief.

## 2016-08-16 NOTE — MAU Note (Signed)
Urine in lab 

## 2016-08-16 NOTE — H&P (Signed)
Dominique Hughes is an 36 y.o. female. Painful contractions  36yo G2P1001 @ 18 weeks by dates but 14 weeks by ultrasound with a demise. Pt had laminaria placed earlier today. Began experiencing painful contractions and presented to MAU she was found to be 4 cm dilated and admitted. Her water broke before leaving MAU. Once she arrived to the floor she delivered the fetus. THe placenta remained insitu. An attempt to removed the placenta was made however the patient was hemorrhaging too briskly to allow for visualization and the decision was made to proceed with emergent D&E   No LMP recorded. Patient is pregnant.    Past Medical History:  Diagnosis Date  . Anemia   . Attention deficit disorder (ADD)   . Fever of unknown origin (FUO)   . Headache    Migaines history   . Hypertension   . PONV (postoperative nausea and vomiting)     Past Surgical History:  Procedure Laterality Date  . CESAREAN SECTION    . DIAPHRAGM SURGERY    . NISSEN FUNDOPLICATION    . SHOULDER SURGERY Right   . SPINAL CORD STIMULATOR INSERTION    . SPINE SURGERY     L5/S1  . TONSILLECTOMY      Family History  Problem Relation Age of Onset  . Cancer Father        skin  . Cancer Maternal Grandmother        breast  . Cancer Paternal Grandmother        lung    Social History:  reports that she has never smoked. She has never used smokeless tobacco. She reports that she does not drink alcohol or use drugs.  Allergies:  Allergies  Allergen Reactions  . Amoxicillin Anaphylaxis  . Penicillins Anaphylaxis    Has patient had a PCN reaction causing immediate rash, facial/tongue/throat swelling, SOB or lightheadedness with hypotension: Yes Has patient had a PCN reaction causing severe rash involving mucus membranes or skin necrosis: No Has patient had a PCN reaction that required hospitalization: Yes Has patient had a PCN reaction occurring within the last 10 years: No If all of the above answers are "NO", then  may proceed with Cephalosporin use.   Marland Kitchen. Apricot Flavor Hives and Swelling    Facial swelling    Prescriptions Prior to Admission  Medication Sig Dispense Refill Last Dose  . acetaminophen (TYLENOL) 325 MG tablet Take 650 mg by mouth every 6 (six) hours as needed for mild pain or headache.     Marland Kitchen. NIFEdipine (PROCARDIA-XL/ADALAT-CC/NIFEDICAL-XL) 30 MG 24 hr tablet Take 30 mg by mouth daily.  8   . Prenatal Vit-Fe Fumarate-FA (PRENATAL MULTIVITAMIN) TABS tablet Take 1 tablet by mouth daily at 12 noon.       ROS  Blood pressure (!) 167/85, pulse 61, temperature 98.2 F (36.8 C), temperature source Oral, resp. rate 18. Physical Exam  No results found for this or any previous visit (from the past 24 hour(s)).  No results found.  Assessment/Plan: 1) Proceed with D&E for hemorrhage  Josefa Syracuse H. 08/16/2016, 11:51 PM

## 2016-08-16 NOTE — MAU Provider Note (Signed)
Chief Complaint: No chief complaint on file.   First Provider Initiated Contact with Patient 08/16/16 2249      SUBJECTIVE HPI: Dominique Hughes is a 36 y.o. G2P1001 at 18 weeks by LMP with recently diagnosed fetal demise who presents to maternity admissions reporting vaginal bleeding and onset of abdominal cramping every 2-3 minutes about 3 hours prior to arrival. Her pain is intermittent, worsening over time, and associated with slight increase in bleeding. She had bleeding a few days ago and light spotting daily since then which she discussed with her doctor. The bleeding is slightly increased tonight but is not heavy per the pt.  There are no other associated symptoms.  She has a D&E scheduled tomorrow morning with Dr Dareen Piano.  She last ate around 6 pm.  She has CHTN and is on Procardia, which she took as scheduled today. She denies vaginal itching/burning, urinary symptoms, h/a, dizziness, n/v, or fever/chills.     HPI  Past Medical History:  Diagnosis Date  . Anemia   . Attention deficit disorder (ADD)   . Fever of unknown origin (FUO)   . Headache    Migaines history   . Hypertension   . PONV (postoperative nausea and vomiting)    Past Surgical History:  Procedure Laterality Date  . CESAREAN SECTION    . DIAPHRAGM SURGERY    . NISSEN FUNDOPLICATION    . SHOULDER SURGERY Right   . SPINAL CORD STIMULATOR INSERTION    . SPINE SURGERY     L5/S1  . TONSILLECTOMY     Social History   Social History  . Marital status: Single    Spouse name: N/A  . Number of children: N/A  . Years of education: N/A   Occupational History  . Not on file.   Social History Main Topics  . Smoking status: Never Smoker  . Smokeless tobacco: Never Used  . Alcohol use No  . Drug use: No  . Sexual activity: Not Currently    Partners: Male   Other Topics Concern  . Not on file   Social History Narrative  . No narrative on file   No current facility-administered medications on file prior  to encounter.    Current Outpatient Prescriptions on File Prior to Encounter  Medication Sig Dispense Refill  . acetaminophen (TYLENOL) 325 MG tablet Take 650 mg by mouth every 6 (six) hours as needed for mild pain or headache.    Marland Kitchen NIFEdipine (PROCARDIA-XL/ADALAT-CC/NIFEDICAL-XL) 30 MG 24 hr tablet Take 30 mg by mouth daily.  8  . Prenatal Vit-Fe Fumarate-FA (PRENATAL MULTIVITAMIN) TABS tablet Take 1 tablet by mouth daily at 12 noon.     Allergies  Allergen Reactions  . Amoxicillin Anaphylaxis  . Penicillins Anaphylaxis    Has patient had a PCN reaction causing immediate rash, facial/tongue/throat swelling, SOB or lightheadedness with hypotension: Yes Has patient had a PCN reaction causing severe rash involving mucus membranes or skin necrosis: No Has patient had a PCN reaction that required hospitalization: Yes Has patient had a PCN reaction occurring within the last 10 years: No If all of the above answers are "NO", then may proceed with Cephalosporin use.   Marland Kitchen Apricot Flavor Hives and Swelling    Facial swelling    ROS:  Review of Systems  Constitutional: Negative for chills, fatigue and fever.  Eyes: Negative for visual disturbance.  Respiratory: Negative for shortness of breath.   Cardiovascular: Negative for chest pain.  Gastrointestinal: Positive for abdominal pain. Negative  for nausea and vomiting.  Genitourinary: Positive for pelvic pain and vaginal bleeding. Negative for difficulty urinating, dysuria, flank pain, vaginal discharge and vaginal pain.  Musculoskeletal: Positive for back pain.  Neurological: Negative for dizziness and headaches.  Psychiatric/Behavioral: Negative.      I have reviewed patient's Past Medical Hx, Surgical Hx, Family Hx, Social Hx, medications and allergies.   Physical Exam   Patient Vitals for the past 24 hrs:  BP Temp Temp src Pulse Resp  08/16/16 2257 (!) 167/85 98.2 F (36.8 C) Oral 61 18    Constitutional: Well-developed,  well-nourished female in no acute distress.  Cardiovascular: normal rate Respiratory: normal effort GI: Abd soft, non-tender. Pos BS x 4 MS: Extremities nontender, no edema, normal ROM Neurologic: Alert and oriented x 4.  GU: Neg CVAT.  Dilation: 4 Effacement (%): 90 Cervical Position: Middle Presentation: Undeterminable Exam by:: Leftwich kirby CNM  BBOW noted on exam, laminaria in vaginal vault, not in cervical canal at this time   MAU Management/MDM: Likely delivery is imminent, pt unsure if she desires D&E or vaginal delivery if she is laboring.  Consult Dr Tenny Crawoss with assessment and findings.  IV fluids, IV pain medication, pt admitted to 3rd floor. Preeclampsia labs ordered on admission. Dr Tenny Crawoss to see pt.  Pt stable at time of transfer.  ASSESSMENT 1. Inevitable spontaneous abortion     PLAN Admit to HROB Unit/3rd floor Fentanyl given, IV fluids started Dr Tenny Crawoss to see pt Preeclampsia labs pending   Sharen CounterLisa Leftwich-Kirby Certified Nurse-Midwife 08/16/2016  11:37 PM

## 2016-08-17 ENCOUNTER — Ambulatory Visit (HOSPITAL_COMMUNITY)
Admission: RE | Admit: 2016-08-17 | Payer: BLUE CROSS/BLUE SHIELD | Source: Ambulatory Visit | Admitting: Obstetrics and Gynecology

## 2016-08-17 ENCOUNTER — Encounter (HOSPITAL_COMMUNITY): Payer: Self-pay | Admitting: *Deleted

## 2016-08-17 ENCOUNTER — Encounter (HOSPITAL_COMMUNITY): Payer: Self-pay | Admitting: Anesthesiology

## 2016-08-17 ENCOUNTER — Inpatient Hospital Stay (HOSPITAL_COMMUNITY): Payer: BLUE CROSS/BLUE SHIELD | Admitting: Anesthesiology

## 2016-08-17 ENCOUNTER — Encounter (HOSPITAL_COMMUNITY)
Admission: AD | Disposition: A | Discharge status: Home / Self Care | Payer: Self-pay | Source: Ambulatory Visit | Attending: Obstetrics and Gynecology

## 2016-08-17 DIAGNOSIS — R58 Hemorrhage, not elsewhere classified: Secondary | ICD-10-CM | POA: Diagnosis present

## 2016-08-17 DIAGNOSIS — O039 Complete or unspecified spontaneous abortion without complication: Secondary | ICD-10-CM

## 2016-08-17 DIAGNOSIS — O034 Incomplete spontaneous abortion without complication: Secondary | ICD-10-CM | POA: Diagnosis present

## 2016-08-17 HISTORY — DX: Headache, unspecified: R51.9

## 2016-08-17 HISTORY — DX: Headache: R51

## 2016-08-17 HISTORY — DX: Other specified postprocedural states: Z98.890

## 2016-08-17 HISTORY — DX: Nausea with vomiting, unspecified: R11.2

## 2016-08-17 LAB — CBC
HCT: 26 % — ABNORMAL LOW (ref 36.0–46.0)
HEMATOCRIT: 29.1 % — AB (ref 36.0–46.0)
HEMATOCRIT: 35.8 % — AB (ref 36.0–46.0)
Hemoglobin: 11.8 g/dL — ABNORMAL LOW (ref 12.0–15.0)
Hemoglobin: 8.7 g/dL — ABNORMAL LOW (ref 12.0–15.0)
Hemoglobin: 9.5 g/dL — ABNORMAL LOW (ref 12.0–15.0)
MCH: 27.5 pg (ref 26.0–34.0)
MCH: 27.9 pg (ref 26.0–34.0)
MCH: 28 pg (ref 26.0–34.0)
MCHC: 32.6 g/dL (ref 30.0–36.0)
MCHC: 33 g/dL (ref 30.0–36.0)
MCHC: 33.5 g/dL (ref 30.0–36.0)
MCV: 83.4 fL (ref 78.0–100.0)
MCV: 83.6 fL (ref 78.0–100.0)
MCV: 85.3 fL (ref 78.0–100.0)
PLATELETS: 287 10*3/uL (ref 150–400)
Platelets: 282 10*3/uL (ref 150–400)
Platelets: 336 10*3/uL (ref 150–400)
RBC: 3.11 MIL/uL — AB (ref 3.87–5.11)
RBC: 3.41 MIL/uL — ABNORMAL LOW (ref 3.87–5.11)
RBC: 4.29 MIL/uL (ref 3.87–5.11)
RDW: 14.9 % (ref 11.5–15.5)
RDW: 14.9 % (ref 11.5–15.5)
RDW: 15 % (ref 11.5–15.5)
WBC: 14.1 10*3/uL — AB (ref 4.0–10.5)
WBC: 15.9 10*3/uL — ABNORMAL HIGH (ref 4.0–10.5)
WBC: 17.2 10*3/uL — ABNORMAL HIGH (ref 4.0–10.5)

## 2016-08-17 LAB — PROTEIN / CREATININE RATIO, URINE
Creatinine, Urine: 195 mg/dL
PROTEIN CREATININE RATIO: 0.11 mg/mg{creat} (ref 0.00–0.15)
TOTAL PROTEIN, URINE: 21 mg/dL

## 2016-08-17 LAB — COMPREHENSIVE METABOLIC PANEL
ALT: 9 U/L — ABNORMAL LOW (ref 14–54)
ANION GAP: 7 (ref 5–15)
AST: 12 U/L — AB (ref 15–41)
Albumin: 2.8 g/dL — ABNORMAL LOW (ref 3.5–5.0)
Alkaline Phosphatase: 39 U/L (ref 38–126)
BILIRUBIN TOTAL: 0.3 mg/dL (ref 0.3–1.2)
BUN: 8 mg/dL (ref 6–20)
CALCIUM: 8.8 mg/dL — AB (ref 8.9–10.3)
CO2: 25 mmol/L (ref 22–32)
Chloride: 106 mmol/L (ref 101–111)
Creatinine, Ser: 0.7 mg/dL (ref 0.44–1.00)
GFR calc Af Amer: >60 mL/min (ref >60)
Glucose, Bld: 119 mg/dL — ABNORMAL HIGH (ref 65–99)
POTASSIUM: 3.8 mmol/L (ref 3.5–5.1)
Sodium: 138 mmol/L (ref 135–145)
TOTAL PROTEIN: 5.6 g/dL — AB (ref 6.5–8.1)

## 2016-08-17 LAB — RPR: RPR: NONREACTIVE

## 2016-08-17 LAB — PREPARE RBC (CROSSMATCH)

## 2016-08-17 LAB — ABO/RH: ABO/RH(D): A POS

## 2016-08-17 SURGERY — DILATION AND CURETTAGE
Anesthesia: Choice

## 2016-08-17 SURGERY — DILATION AND EVACUATION, UTERUS, SECOND TRIMESTER
Anesthesia: Choice

## 2016-08-17 MED ORDER — LACTATED RINGERS IV SOLN: INTRAVENOUS | Status: DC

## 2016-08-17 MED ORDER — SCOPOLAMINE 1 MG/3DAYS TD PT72
MEDICATED_PATCH | TRANSDERMAL | Status: DC | PRN
  Administered 2016-08-17: 1 via TRANSDERMAL

## 2016-08-17 MED ORDER — DEXAMETHASONE SODIUM PHOSPHATE 10 MG/ML IJ SOLN
INTRAMUSCULAR | Status: DC | PRN
  Administered 2016-08-17: 4 mg via INTRAVENOUS

## 2016-08-17 MED ORDER — ONDANSETRON HCL 4 MG/2ML IJ SOLN
INTRAMUSCULAR | Status: AC
  Filled 2016-08-17: qty 2

## 2016-08-17 MED ORDER — ONDANSETRON HCL 4 MG/2ML IJ SOLN: 4.0000 mg | Freq: Four times a day (QID) | INTRAMUSCULAR | Status: DC | PRN

## 2016-08-17 MED ORDER — FENTANYL CITRATE (PF) 100 MCG/2ML IJ SOLN
INTRAMUSCULAR | Status: DC | PRN
  Administered 2016-08-17: 100 ug via INTRAVENOUS

## 2016-08-17 MED ORDER — LIDOCAINE HCL (PF) 1 % IJ SOLN
30.0000 mL | INTRAMUSCULAR | Status: DC | PRN
  Filled 2016-08-17: qty 30

## 2016-08-17 MED ORDER — BUPIVACAINE HCL 0.5 % IJ SOLN
INTRAMUSCULAR | Status: DC | PRN
  Administered 2016-08-17: 10 mL

## 2016-08-17 MED ORDER — SUCCINYLCHOLINE CHLORIDE 200 MG/10ML IV SOSY
PREFILLED_SYRINGE | INTRAVENOUS | Status: AC
  Filled 2016-08-17: qty 10

## 2016-08-17 MED ORDER — PROPOFOL 10 MG/ML IV BOLUS
INTRAVENOUS | Status: AC
  Filled 2016-08-17: qty 20

## 2016-08-17 MED ORDER — OXYTOCIN BOLUS FROM INFUSION: 500.0000 mL | Freq: Once | INTRAVENOUS | Status: DC

## 2016-08-17 MED ORDER — SCOPOLAMINE 1 MG/3DAYS TD PT72
MEDICATED_PATCH | TRANSDERMAL | Status: AC
Start: 2016-08-17 — End: 2016-08-17
  Filled 2016-08-17: qty 1

## 2016-08-17 MED ORDER — PHENYLEPHRINE HCL 10 MG/ML IJ SOLN
INTRAMUSCULAR | Status: DC | PRN
  Administered 2016-08-17 (×2): 80 ug via INTRAVENOUS

## 2016-08-17 MED ORDER — PHENYLEPHRINE 40 MCG/ML (10ML) SYRINGE FOR IV PUSH (FOR BLOOD PRESSURE SUPPORT)
PREFILLED_SYRINGE | INTRAVENOUS | Status: AC
  Filled 2016-08-17: qty 10

## 2016-08-17 MED ORDER — BUPIVACAINE HCL (PF) 0.25 % IJ SOLN
INTRAMUSCULAR | Status: AC
  Filled 2016-08-17: qty 10

## 2016-08-17 MED ORDER — DEXAMETHASONE SODIUM PHOSPHATE 4 MG/ML IJ SOLN
INTRAMUSCULAR | Status: AC
  Filled 2016-08-17: qty 1

## 2016-08-17 MED ORDER — FENTANYL CITRATE (PF) 100 MCG/2ML IJ SOLN
INTRAMUSCULAR | Status: AC
  Filled 2016-08-17: qty 2

## 2016-08-17 MED ORDER — ACETAMINOPHEN 325 MG PO TABS: 650.0000 mg | ORAL_TABLET | ORAL | Status: DC | PRN

## 2016-08-17 MED ORDER — OXYCODONE-ACETAMINOPHEN 5-325 MG PO TABS: 1.0000 | ORAL_TABLET | ORAL | Status: DC | PRN

## 2016-08-17 MED ORDER — MIDAZOLAM HCL 2 MG/2ML IJ SOLN
INTRAMUSCULAR | Status: DC | PRN
  Administered 2016-08-17: 2 mg via INTRAVENOUS

## 2016-08-17 MED ORDER — OXYCODONE-ACETAMINOPHEN 5-325 MG PO TABS
1.0000 | ORAL_TABLET | ORAL | Status: DC | PRN
  Administered 2016-08-17 (×3): 1 via ORAL
  Filled 2016-08-17 (×3): qty 1

## 2016-08-17 MED ORDER — OXYTOCIN 40 UNITS IN LACTATED RINGERS INFUSION - SIMPLE MED: 2.5000 [IU]/h | INTRAVENOUS | Status: DC

## 2016-08-17 MED ORDER — ONDANSETRON HCL 4 MG/2ML IJ SOLN
INTRAMUSCULAR | Status: DC | PRN
  Administered 2016-08-17: 4 mg via INTRAVENOUS

## 2016-08-17 MED ORDER — PROPOFOL 10 MG/ML IV BOLUS
INTRAVENOUS | Status: DC | PRN
  Administered 2016-08-17: 150 mg via INTRAVENOUS

## 2016-08-17 MED ORDER — OXYCODONE-ACETAMINOPHEN 5-325 MG PO TABS: 1.0000 | ORAL_TABLET | ORAL | 0 refills | Status: DC | PRN

## 2016-08-17 MED ORDER — LACTATED RINGERS IV SOLN: 500.0000 mL | INTRAVENOUS | Status: DC | PRN

## 2016-08-17 MED ORDER — IBUPROFEN 600 MG PO TABS
600.0000 mg | ORAL_TABLET | Freq: Four times a day (QID) | ORAL | Status: DC | PRN
  Administered 2016-08-17 (×2): 600 mg via ORAL
  Filled 2016-08-17 (×2): qty 1

## 2016-08-17 MED ORDER — ONDANSETRON HCL 4 MG PO TABS: 4.0000 mg | ORAL_TABLET | Freq: Four times a day (QID) | ORAL | Status: DC | PRN

## 2016-08-17 MED ORDER — ONDANSETRON HCL 4 MG/2ML IJ SOLN
4.0000 mg | Freq: Four times a day (QID) | INTRAMUSCULAR | Status: DC | PRN
Start: 2016-08-17 — End: 2016-08-17

## 2016-08-17 MED ORDER — IBUPROFEN 600 MG PO TABS: 600.0000 mg | ORAL_TABLET | Freq: Four times a day (QID) | ORAL | 0 refills | Status: DC | PRN

## 2016-08-17 MED ORDER — MIDAZOLAM HCL 2 MG/2ML IJ SOLN
INTRAMUSCULAR | Status: AC
  Filled 2016-08-17: qty 2

## 2016-08-17 MED ORDER — OXYCODONE-ACETAMINOPHEN 5-325 MG PO TABS: 2.0000 | ORAL_TABLET | ORAL | Status: DC | PRN

## 2016-08-17 MED ORDER — FENTANYL CITRATE (PF) 100 MCG/2ML IJ SOLN
25.0000 ug | INTRAMUSCULAR | Status: DC | PRN
  Administered 2016-08-17: 25 ug via INTRAVENOUS

## 2016-08-17 MED ORDER — SCOPOLAMINE 1 MG/3DAYS TD PT72
1.0000 | MEDICATED_PATCH | Freq: Once | TRANSDERMAL | Status: DC
  Filled 2016-08-17: qty 1

## 2016-08-17 MED ORDER — NIFEDIPINE ER OSMOTIC RELEASE 30 MG PO TB24: 30.0000 mg | ORAL_TABLET | Freq: Every day | ORAL | Status: DC

## 2016-08-17 MED ORDER — SUCCINYLCHOLINE CHLORIDE 20 MG/ML IJ SOLN
INTRAMUSCULAR | Status: DC | PRN
  Administered 2016-08-17: 180 mg via INTRAVENOUS

## 2016-08-17 MED ORDER — SOD CITRATE-CITRIC ACID 500-334 MG/5ML PO SOLN: 30.0000 mL | ORAL | Status: DC | PRN

## 2016-08-17 MED ORDER — BUPIVACAINE HCL (PF) 0.5 % IJ SOLN
INTRAMUSCULAR | Status: AC
  Filled 2016-08-17: qty 30

## 2016-08-17 NOTE — Op Note (Addendum)
Pre-Operative Diagnosis: 1) retained placenta 2) hemorrhage Postoperative Diagnosis: 1) retained placenta 2) hemorrhage Procedure: Suction dilation and evacuation Surgeon: Dr. Waynard ReedsKendra Khiry Pasquariello Assistant: None Operative Findings: Retained placenta. Cervix dilated to 2-3 cm. Specimen: Products of conception EBL: Total I/O In: 700 [I.V.:700] Out: 250 [Urine:50; Blood:200]   [name] Is a 36 year old gravida 2 para 1001 who presents for definitive surgical management for retained placenta and hemorrhage. The patient presented to the hospital complaining of painful contractions. She was sent to be 4 cm dilated. She was quickly transferred to the third floor. She delivered the fetus upon arrival to the third floor, however the placenta remained in situ. Due to their retained placenta the patient continued to hemorrhage. An attempt was made to remove the placenta on the third floor however this was unsuccessful. Given the patient's significant blood loss the decision was made to proceed with emergent suction dilation and evacuation for retained products of conception and hemorrhage. Estimated blood loss prior to surgery was approximately 1500 cc. Management options were discussed with the patient. R/B/A reviewed. Following appropriate informed consent was taken to the operating room. The patient was appropriately identified during a time out procedure. General anesthesia was administered and the patient was placed in the dorsal lithotomy position. The patient was prepped and draped in the normal sterile fashion. A speculum was placed into the vagina, a single-tooth tenaculum was placed on the anterior lip of the cervix. Ring forceps were used to evacuate the clots in the vagina. Once the cervix could be adequately visualized a portion of the placental tissue was grasped with the rings forceps at the external os and manually removed. A 10 suction curette was then used to perform successive suction passes. A sharp  curettage was then performed. Additional suction passes and sharp curettage were performed until the uterus was completely evacuated and bleeding was minimal. 10 cc of half percent Marcaine were injected in a paracervical fashion. The speculum was then removed from the vagina and a bimanual exam was performed. The uterus was noted to be firm and bleeding was minimal. This completed the surgical procedure. All sponge, lap, needle counts were correct. The patient was extubated in the operating room and transferred to the PACU in stable condition following the procedure.

## 2016-08-17 NOTE — Progress Notes (Signed)
I offered grief support and grief education to [place] and Minerva Areolaric over several visits and helped facilitate additional picture taking and time with their baby.  We spoke about how to talk with their six-year old son about Clare GandyBaby Ella and I brought them grief resources for them as well as for their son.    They reported good family support including that Tashiya's parents live with them as her father has just been diagnosed with Parkinson's.  They acknowledged the significant stressors in their life, and are supporting each other well through them.  Chaplain Katy Demarr Kluever, Bcc Pager, [phone] 3:30 PM    08/17/16 1500  Clinical Encounter Type  Visited With Patient and family together  Visit Type Spiritual support  Referral From Nurse  Spiritual Encounters  Spiritual Needs Emotional;Grief support  Stress Factors  Patient Stress Factors Loss

## 2016-08-17 NOTE — Discharge Summary (Signed)
Pt was admitted after she had a D&E for an incomplete AB. She was scheduled for a D&E on 7/10 for a 15 week fetal demise. She came to the ER cramping and bleeding. She had a D&E by Dr. Tenny Crawoss. Please see op note.She had a nl HGB post op. She was doing well post op. She was discharged to home with Motrin. Path pending

## 2016-08-17 NOTE — Anesthesia Postprocedure Evaluation (Signed)
Anesthesia Post Note  Patient: Dominique Hughes  Procedure(s) Performed: * No procedures listed *     Patient location during evaluation: [facility] Anesthesia Type: General Level of consciousness: awake and alert and oriented Pain management: pain level controlled Vital Signs Assessment: post-procedure vital signs reviewed and stable Respiratory status: nonlabored ventilation, spontaneous breathing and respiratory function stable Cardiovascular status: stable Postop Assessment: no signs of nausea or vomiting and adequate PO intake Anesthetic complications: no    Last Vitals:  Vitals:   08/17/16 0315 08/17/16 0605  BP: (!) 103/50 117/72  Pulse: 64 70  Resp: 20 18  Temp:  36.9 C    Last Pain:  Vitals:   08/17/16 0625  TempSrc:   PainSc: 0-No pain   Pain Goal: Patients Stated Pain Goal: 3 (08/17/16 0200)               Madison HickmanGREGORY,Minh Roanhorse

## 2016-08-17 NOTE — Progress Notes (Signed)
Walked pt. To bathroom pt. Got light headed with ringing in ears pt. Remained alert transferred to w/c and then to bed pt. Remains alert vs 103/50 64 98% on ra head lowered and feet elevated pt. Feeling better shortly after getting back to bed pt. Pt. Drinking ginger ale and feeling much better call light in reach

## 2016-08-17 NOTE — Anesthesia Preprocedure Evaluation (Addendum)
Anesthesia Evaluation  Patient identified by MRN, date of birth, ID band Patient awake    Reviewed: Allergy & Precautions, H&P , Patient's Chart, lab work & pertinent test results, reviewed documented beta blocker date and time   Airway Mallampati: II  TM Distance: >3 FB Neck ROM: full    Dental no notable dental hx.    Pulmonary    Pulmonary exam normal breath sounds clear to auscultation       Cardiovascular hypertension,  Rhythm:regular Rate:Normal     Neuro/Psych    GI/Hepatic   Endo/Other    Renal/GU      Musculoskeletal   Abdominal   Peds  Hematology   Anesthesia Other Findings   Reproductive/Obstetrics                             Anesthesia Physical Anesthesia Plan  ASA: II and emergent  Anesthesia Plan: General   Post-op Pain Management:    Induction: Intravenous, Rapid sequence and Cricoid pressure planned  PONV Risk Score and Plan:   Airway Management Planned: Oral ETT  Additional Equipment:   Intra-op Plan:   Post-operative Plan: Extubation in OR  Informed Consent: I have reviewed the patients History and Physical, chart, labs and discussed the procedure including the risks, benefits and alternatives for the proposed anesthesia with the patient or authorized representative who has indicated his/her understanding and acceptance.   Dental Advisory Given  Plan Discussed with: CRNA and Surgeon  Anesthesia Plan Comments: (  )       Anesthesia Quick Evaluation

## 2016-08-17 NOTE — Progress Notes (Signed)
POD#1 Pt doing well.She has minimal vaginal bleeding. Pain has improved VSSAF IMP/ Doing well Plan/ Will discharge tohome

## 2016-08-17 NOTE — Anesthesia Procedure Notes (Signed)
Procedure Name: Intubation Date/Time: 08/16/2016 12:19 AM Performed by: Skylen Danielsen, Sheron Nightingale Pre-anesthesia Checklist: Patient identified, Emergency Drugs available, Suction available, Patient being monitored and Timeout performed Patient Re-evaluated:Patient Re-evaluated prior to inductionOxygen Delivery Method: Circle system utilized Preoxygenation: Pre-oxygenation with 100% oxygen Intubation Type: IV induction Ventilation: Mask ventilation without difficulty Laryngoscope Size: Mac and 3 Grade View: Grade I Tube size: 7.5 mm Number of attempts: 1 Placement Confirmation: ETT inserted through vocal cords under direct vision and positive ETCO2 Secured at: 21 cm Dental Injury: Teeth and Oropharynx as per pre-operative assessment

## 2016-08-17 NOTE — Transfer of Care (Signed)
Immediate Anesthesia Transfer of Care Note  Patient: Dominique Hughes  Procedure(s) Performed: Procedure(s): DILATATION AND CURETTAGE (N/A)  Patient Location: PACU  Anesthesia Type:General  Level of Consciousness: awake, alert  and oriented  Airway & Oxygen Therapy: Patient Spontanous Breathing and Patient connected to nasal cannula oxygen  Post-op Assessment: Report given to RN and Post -op Vital signs reviewed and stable  Post vital signs: Reviewed and stable  Last Vitals:  Vitals:   08/16/16 2257 08/16/16 2350  BP: (!) 167/85 (!) 152/73  Pulse: 61 (!) 42  Resp: 18 17  Temp: 36.8 C 37.1 C    Last Pain:  Vitals:   08/16/16 2350  TempSrc: Oral  PainSc:          Complications: No apparent anesthesia complications

## 2016-08-17 NOTE — Lactation Note (Signed)
Lactation Consultation Note  Patient Name: Arta Brucelanie R Lakeman ZOXWR'UToday's Date: 08/17/2016   Initial consult with mom of 18 week fetal loss. Mom reports her milk did come in with older child, reviewed that milk may come in after the loss. Enc mom to use cabbage leaves, ice packs and supportive bra. Mom is aware of how to hand express and knows she can hand express for comfort.  Lactation After Loss Brochure and [facility] Business card given. Enc mom to call [facility] with any questions/concerns. Mom without questions/concerns at this time.      Maternal Data    Feeding    LATCH Score/Interventions                      Lactation Tools Discussed/Used     Consult Status      Ed BlalockSharon S Roxann Vierra 08/17/2016, 11:04 AM

## 2016-08-17 NOTE — Progress Notes (Addendum)
2335 Pt arrived to Room 321 via stretcher with Dr Tenny Crawoss in attendance.  2340 Pt delivered nonviable fetus  0000 Pt transferred to OR via bed

## 2016-08-20 ENCOUNTER — Inpatient Hospital Stay (HOSPITAL_COMMUNITY)
Admission: AD | Admit: 2016-08-20 | Discharge: 2016-08-20 | Disposition: A | Discharge status: Home / Self Care | Payer: BLUE CROSS/BLUE SHIELD | Source: Ambulatory Visit | Attending: Emergency Medicine | Admitting: Emergency Medicine

## 2016-08-20 ENCOUNTER — Other Ambulatory Visit: Payer: Self-pay

## 2016-08-20 ENCOUNTER — Encounter (HOSPITAL_COMMUNITY): Payer: Self-pay | Admitting: *Deleted

## 2016-08-20 DIAGNOSIS — E876 Hypokalemia: Secondary | ICD-10-CM | POA: Diagnosis not present

## 2016-08-20 DIAGNOSIS — D500 Iron deficiency anemia secondary to blood loss (chronic): Secondary | ICD-10-CM | POA: Insufficient documentation

## 2016-08-20 DIAGNOSIS — O162 Unspecified maternal hypertension, second trimester: Secondary | ICD-10-CM | POA: Insufficient documentation

## 2016-08-20 DIAGNOSIS — Z3A14 14 weeks gestation of pregnancy: Secondary | ICD-10-CM | POA: Diagnosis not present

## 2016-08-20 DIAGNOSIS — I4581 Long QT syndrome: Secondary | ICD-10-CM | POA: Diagnosis not present

## 2016-08-20 DIAGNOSIS — Z880 Allergy status to penicillin: Secondary | ICD-10-CM | POA: Insufficient documentation

## 2016-08-20 DIAGNOSIS — O364XX0 Maternal care for intrauterine death, not applicable or unspecified: Secondary | ICD-10-CM | POA: Diagnosis not present

## 2016-08-20 DIAGNOSIS — I493 Ventricular premature depolarization: Secondary | ICD-10-CM | POA: Insufficient documentation

## 2016-08-20 DIAGNOSIS — O0383 Metabolic disorder following complete or unspecified spontaneous abortion: Secondary | ICD-10-CM | POA: Insufficient documentation

## 2016-08-20 DIAGNOSIS — O26892 Other specified pregnancy related conditions, second trimester: Secondary | ICD-10-CM | POA: Diagnosis not present

## 2016-08-20 DIAGNOSIS — Z79899 Other long term (current) drug therapy: Secondary | ICD-10-CM | POA: Diagnosis not present

## 2016-08-20 DIAGNOSIS — R55 Syncope and collapse: Secondary | ICD-10-CM

## 2016-08-20 DIAGNOSIS — I498 Other specified cardiac arrhythmias: Secondary | ICD-10-CM

## 2016-08-20 DIAGNOSIS — R002 Palpitations: Secondary | ICD-10-CM | POA: Diagnosis not present

## 2016-08-20 DIAGNOSIS — R42 Dizziness and giddiness: Secondary | ICD-10-CM | POA: Insufficient documentation

## 2016-08-20 DIAGNOSIS — I499 Cardiac arrhythmia, unspecified: Secondary | ICD-10-CM

## 2016-08-20 DIAGNOSIS — R008 Other abnormalities of heart beat: Secondary | ICD-10-CM | POA: Insufficient documentation

## 2016-08-20 DIAGNOSIS — R9431 Abnormal electrocardiogram [ECG] [EKG]: Secondary | ICD-10-CM

## 2016-08-20 LAB — CBC WITH DIFFERENTIAL/PLATELET
BASOS ABS: 0 10*3/uL (ref 0.0–0.1)
Basophils Relative: 0 %
EOS ABS: 0.2 10*3/uL (ref 0.0–0.7)
EOS PCT: 2 %
HCT: 25.9 % — ABNORMAL LOW (ref 36.0–46.0)
Hemoglobin: 8.6 g/dL — ABNORMAL LOW (ref 12.0–15.0)
Lymphocytes Relative: 27 %
Lymphs Abs: 3.2 10*3/uL (ref 0.7–4.0)
MCH: 27.8 pg (ref 26.0–34.0)
MCHC: 33.2 g/dL (ref 30.0–36.0)
MCV: 83.8 fL (ref 78.0–100.0)
Monocytes Absolute: 0.5 10*3/uL (ref 0.1–1.0)
Monocytes Relative: 4 %
Neutro Abs: 8 10*3/uL — ABNORMAL HIGH (ref 1.7–7.7)
Neutrophils Relative %: 67 %
Platelets: 356 10*3/uL (ref 150–400)
RBC: 3.09 MIL/uL — AB (ref 3.87–5.11)
RDW: 14.8 % (ref 11.5–15.5)
WBC: 11.9 10*3/uL — AB (ref 4.0–10.5)

## 2016-08-20 LAB — I-STAT CHEM 8, ED
BUN: 3 mg/dL — AB (ref 6–20)
Calcium, Ion: 1.15 mmol/L (ref 1.15–1.40)
Chloride: 103 mmol/L (ref 101–111)
Creatinine, Ser: 0.5 mg/dL (ref 0.44–1.00)
Glucose, Bld: 107 mg/dL — ABNORMAL HIGH (ref 65–99)
HEMATOCRIT: 26 % — AB (ref 36.0–46.0)
Hemoglobin: 8.8 g/dL — ABNORMAL LOW (ref 12.0–15.0)
Potassium: 3.2 mmol/L — ABNORMAL LOW (ref 3.5–5.1)
SODIUM: 142 mmol/L (ref 135–145)
TCO2: 25 mmol/L (ref 0–100)

## 2016-08-20 LAB — TYPE AND SCREEN
ABO/RH(D): A POS
ANTIBODY SCREEN: NEGATIVE
UNIT DIVISION: 0
UNIT DIVISION: 0

## 2016-08-20 LAB — COMPREHENSIVE METABOLIC PANEL
ALT: 15 U/L (ref 14–54)
AST: 25 U/L (ref 15–41)
Albumin: 3.3 g/dL — ABNORMAL LOW (ref 3.5–5.0)
Alkaline Phosphatase: 44 U/L (ref 38–126)
Anion gap: 11 (ref 5–15)
BUN: 8 mg/dL (ref 6–20)
CHLORIDE: 103 mmol/L (ref 101–111)
CO2: 24 mmol/L (ref 22–32)
CREATININE: 0.67 mg/dL (ref 0.44–1.00)
Calcium: 9.1 mg/dL (ref 8.9–10.3)
GFR calc non Af Amer: >60 mL/min (ref >60)
Glucose, Bld: 92 mg/dL (ref 65–99)
POTASSIUM: 3.4 mmol/L — AB (ref 3.5–5.1)
SODIUM: 138 mmol/L (ref 135–145)
Total Bilirubin: 0.4 mg/dL (ref 0.3–1.2)
Total Protein: 6.3 g/dL — ABNORMAL LOW (ref 6.5–8.1)

## 2016-08-20 LAB — BPAM RBC
BLOOD PRODUCT EXPIRATION DATE: 201807312359
Blood Product Expiration Date: 201807312359
ISSUE DATE / TIME: 201807100024
ISSUE DATE / TIME: 201807100024
Unit Type and Rh: 6200
Unit Type and Rh: 6200

## 2016-08-20 LAB — URINALYSIS, ROUTINE W REFLEX MICROSCOPIC
BILIRUBIN URINE: NEGATIVE
Glucose, UA: NEGATIVE mg/dL
KETONES UR: NEGATIVE mg/dL
Leukocytes, UA: NEGATIVE
Nitrite: NEGATIVE
PROTEIN: 100 mg/dL — AB
Specific Gravity, Urine: 1.001 — ABNORMAL LOW (ref 1.005–1.030)
pH: 7 (ref 5.0–8.0)

## 2016-08-20 LAB — MAGNESIUM: MAGNESIUM: 1.7 mg/dL (ref 1.7–2.4)

## 2016-08-20 LAB — URINALYSIS, MICROSCOPIC (REFLEX)

## 2016-08-20 MED ORDER — POTASSIUM CHLORIDE CRYS ER 20 MEQ PO TBCR: 20.0000 meq | EXTENDED_RELEASE_TABLET | Freq: Two times a day (BID) | ORAL | 0 refills | Status: DC

## 2016-08-20 MED ORDER — LACTATED RINGERS IV BOLUS (SEPSIS)
1000.0000 mL | Freq: Once | INTRAVENOUS | Status: AC
  Administered 2016-08-20: 1000 mL via INTRAVENOUS

## 2016-08-20 MED ORDER — POTASSIUM CHLORIDE 10 MEQ/100ML IV SOLN
10.0000 meq | INTRAVENOUS | Status: AC
  Administered 2016-08-20 (×2): 10 meq via INTRAVENOUS
  Filled 2016-08-20 (×2): qty 100

## 2016-08-20 MED ORDER — LACTATED RINGERS IV SOLN
INTRAVENOUS | Status: DC
  Administered 2016-08-20: 17:00:00 via INTRAVENOUS

## 2016-08-20 MED ORDER — POTASSIUM CHLORIDE CRYS ER 20 MEQ PO TBCR
40.0000 meq | EXTENDED_RELEASE_TABLET | Freq: Once | ORAL | Status: AC
  Administered 2016-08-20: 40 meq via ORAL
  Filled 2016-08-20: qty 2

## 2016-08-20 NOTE — MAU Note (Signed)
Attempted to do Orthostatic B/P's. Had pt laying down and sat her up in bed and pt stated "she di not feel good" pulse ox was registring O2 sat 98% and pulse down to 34. layed patient back down and HR returned to 50's and she "felt Better". Notified E.Key, NP of incident. IV bolus started, labs, EKG ordered.

## 2016-08-20 NOTE — ED Notes (Signed)
Urine in lab 

## 2016-08-20 NOTE — Discharge Instructions (Signed)
Make sure to increase her potassium intake. Take medications as prescribed. Follow-up with your OB/GYN doctor as we discussed

## 2016-08-20 NOTE — Significant Event (Signed)
Rapid Response Event Note  Overview: Time Called: 1624 Arrival Time: 1627 Event Type: Cardiac, Other (Comment) (hypertension, bradycardia)  Initial Focused Assessment:   Interventions:  Plan of Care (if not transferred):  Event Summary: Name of Physician Notified: Dareen Pianoanderson at    Name of Consulting Physician Notified: turner at    Outcome: Stayed in room and stabalized     SubletteWalls, ElsmoreJessica B

## 2016-08-20 NOTE — ED Notes (Signed)
The pt [facility]  [facility]  Last week    She was given  [facility]cc [facility]  She gets dizzy and lightheaded whenever she stands since the d ande   .  She feels faint.  At present she is alert and oriented skin warm and dry  She last ate this a,   Second pregnancy  She has a 34 yr old at home

## 2016-08-20 NOTE — MAU Note (Signed)
Called Rapid Response to evaluate patient

## 2016-08-20 NOTE — ED Provider Notes (Signed)
[facility] DEPT Provider Note   CSN: [id number] Arrival date & time: 08/20/16  1501     History   Chief Complaint Chief Complaint  Patient presents with  . Dizziness    HPI Dominique Hughes is a 36 y.o. female.  HPI Patient presents to the emergency room for evaluation of dizziness and lightheadedness. Patient had a D&E procedure on July 10 for fetal demise. Patient states she's been having trouble with dizziness and shaking when she tries to stand. Patient's procedure was complicated with difficulty removing her placenta. Patient had significant amount of blood loss. [facility] She was noted to have her heart rate dropping into the 30s when she stood. She did have palpitations while she was there. Noted unifocal PVCs on the monitor. Patient was given 10 mEq of potassium. She was sent to the ED for cardiac monitoring. Past Medical History:  Diagnosis Date  . Anemia   . Attention deficit disorder (ADD)   . Fever of unknown origin (FUO)   . Headache    Migaines history   . Hypertension   . PONV (postoperative nausea and vomiting)     Patient Active Problem List   Diagnosis Date Noted  . Inevitable spontaneous abortion 08/17/2016  . Hemorrhage 08/17/2016  . H/O abdominal pain 10/10/2012  . Fever of unknown origin (FUO)     Past Surgical History:  Procedure Laterality Date  . CESAREAN SECTION    . DIAPHRAGM SURGERY    . NISSEN FUNDOPLICATION    . SHOULDER SURGERY Right   . SPINAL CORD STIMULATOR INSERTION    . SPINE SURGERY     L5/S1  . TONSILLECTOMY      OB History    Gravida Para Term Preterm AB Living   2 1 1     1    SAB TAB Ectopic Multiple Live Births                   Home Medications    Prior to Admission medications   Medication Sig Start Date End Date Taking? Authorizing Provider  ibuprofen (ADVIL,MOTRIN) 600 MG tablet Take 1 tablet (600 mg total) by mouth every 6 (six) hours as needed (mild pain). 08/17/16  Yes  Levi AlandAnderson, Mark E, MD  IRON PO Take 1 tablet by mouth daily. otc medication, not sure of dose   Yes [provider]  NIFEdipine (PROCARDIA-XL/ADALAT-CC/NIFEDICAL-XL) 30 MG 24 hr tablet Take 30 mg by mouth daily.   Yes [provider]  oxyCODONE-acetaminophen (PERCOCET/ROXICET) 5-325 MG tablet Take 1-2 tablets by mouth every 4 (four) hours as needed (moderate to severe pain (when tolerating fluids)). 08/17/16  Yes Levi AlandAnderson, Mark E, MD  Prenatal Vit-Fe Fumarate-FA (PRENATAL MULTIVITAMIN) TABS tablet Take 1 tablet by mouth daily at 12 noon.   Yes [provider]  potassium chloride SA (K-DUR,KLOR-CON) 20 MEQ tablet Take 1 tablet (20 mEq total) by mouth 2 (two) times daily. 08/20/16   Linwood DibblesKnapp, Clemons Salvucci, MD    Family History Family History  Problem Relation Age of Onset  . Cancer Father        skin  . Cancer Maternal Grandmother        breast  . Cancer Paternal Grandmother        lung    Social History Social History  Substance Use Topics  . Smoking status: Never Smoker  . Smokeless tobacco: Never Used  . Alcohol use No     Allergies   Amoxicillin; Penicillins;  and Apricot flavor   Review of Systems Review of Systems  All other systems reviewed and are negative.    Physical Exam Updated Vital Signs BP 133/73   Pulse 78   Temp 98.7 F (37.1 C) (Oral)   Resp 16   Ht 1.727 m (5\' 8" )   Wt 104.8 kg (231 lb)   SpO2 97%   Breastfeeding? Unknown   BMI 35.12 kg/m   Physical Exam  Constitutional: She appears well-developed and well-nourished. No distress.  HENT:  Head: Normocephalic and atraumatic.  Right Ear: External ear normal.  Left Ear: External ear normal.  Eyes: Conjunctivae are normal. Right eye exhibits no discharge. Left eye exhibits no discharge. No scleral icterus.  Neck: Neck supple. No tracheal deviation present.  Cardiovascular: Normal rate, regular rhythm and intact distal pulses.   Pulmonary/Chest: Effort normal and breath sounds normal.  No stridor. No respiratory distress. She has no wheezes. She has no rales.  Abdominal: Soft. Bowel sounds are normal. She exhibits no distension. There is no tenderness. There is no rebound and no guarding.  Musculoskeletal: She exhibits no edema or tenderness.  Neurological: She is alert. She has normal strength. No cranial nerve deficit (no facial droop, extraocular movements intact, no slurred speech) or sensory deficit. She exhibits normal muscle tone. She displays no seizure activity. Coordination normal.  Skin: Skin is warm and dry. No rash noted.  Psychiatric: She has a normal mood and affect.  Nursing note and vitals reviewed.    ED Treatments / Results  Labs (all labs ordered are listed, but only abnormal results are displayed) Labs Reviewed  URINALYSIS, ROUTINE W REFLEX MICROSCOPIC - Abnormal; Notable for the following:       Result Value   Color, Urine RED (*)    Specific Gravity, Urine 1.001 (*)    Hgb urine dipstick LARGE (*)    Protein, ur 100 (*)    All other components within normal limits  CBC WITH DIFFERENTIAL/PLATELET - Abnormal; Notable for the following:    WBC 11.9 (*)    RBC 3.09 (*)    Hemoglobin 8.6 (*)    HCT 25.9 (*)    Neutro Abs 8.0 (*)    All other components within normal limits  COMPREHENSIVE METABOLIC PANEL - Abnormal; Notable for the following:    Potassium 3.4 (*)    Total Protein 6.3 (*)    Albumin 3.3 (*)    All other components within normal limits  URINALYSIS, MICROSCOPIC (REFLEX) - Abnormal; Notable for the following:    Bacteria, UA RARE (*)    Squamous Epithelial / LPF 0-5 (*)    All other components within normal limits  I-STAT CHEM 8, ED - Abnormal; Notable for the following:    Potassium 3.2 (*)    BUN 3 (*)    Glucose, Bld 107 (*)    Hemoglobin 8.8 (*)    HCT 26.0 (*)    All other components within normal limits  MAGNESIUM    EKG  EKG Interpretation None       Radiology No results found.  Procedures Procedures  (including critical care time)  Medications Ordered in ED Medications  lactated ringers infusion ( Intravenous New Bag/Given 08/20/16 1637)  lactated ringers bolus 1,000 mL (0 mLs Intravenous Stopped 08/20/16 1853)  potassium chloride 10 mEq in 100 mL IVPB (0 mEq Intravenous Stopped 08/20/16 1945)  potassium chloride SA (K-DUR,KLOR-CON) CR tablet 40 mEq (40 mEq Oral Given 08/20/16 2304)     Initial  Impression / Assessment and Plan / ED Course  I have reviewed the triage vital signs and the nursing notes.  Pertinent labs & imaging results that were available during my care of the patient were reviewed by me and considered in my medical decision making (see chart for details).   patient was sent to be monitored for issues with hypokalemia, anemia and PVCs. Patient is anemic associated with her recent D&E procedure. Hemoglobin is remaining stable.  I do suspect this is the cause of some of her orthostatic symptoms. She also is noted to have hypokalemia. This is contributing to her PVCs. She was given potassium supplementation. Patient was monitored for several hours. No other cardiac dysrhythmia. I think she can be safely discharged home with potassium supplements.  Final Clinical Impressions(s) / ED Diagnoses   Final diagnoses:  Postural dizziness with near syncope  Ventricular bigeminy  Prolonged Q-T interval on ECG  Anemia, blood loss  Hypokalemia    New Prescriptions New Prescriptions   POTASSIUM CHLORIDE SA (K-DUR,KLOR-CON) 20 MEQ TABLET    Take 1 tablet (20 mEq total) by mouth 2 (two) times daily.     Linwood Dibbles, MD 08/20/16 [id number]

## 2016-08-20 NOTE — MAU Note (Signed)
HR continued (via pulse ox) to fluctuate between 30-90b/m.  EKG pending review by Cardiology. V/SS. Pt stated she feel Ok as long as she lays stil and does not change position too quickly. Denies any chest pain or pressure but does reports she feels her heart beating at time in her ears.

## 2016-08-20 NOTE — MAU Note (Signed)
Report called to [facility],RN [facility]. Care at bedside to transport. VSS. Cardiac monitor still showing Bigeminy.

## 2016-08-20 NOTE — MAU Provider Note (Signed)
History     CSN: [id number]  Arrival date and time: 08/20/16 1501   None     Chief Complaint  Patient presents with  . Dizziness   HPI Dominique Hughes is 36 y.o. G2P1001 presents with report of not feeling "right" since D&E on 7/10.  Also having  dizziness, shakiness with standing.  Tried to increase po fluids without relief of sxs.   She is a patient of Dr. Tenny Crawoss.  Recent hx of fetal demise at 14 weeks by U/S, 18 weeks by Dates.  She was admitted on 7/10, laminera placed and delivered vaginally.  Placenta remained in situ per Dr. Charlott Rakesoss's note.  Difficulty removing placenta due to hemorrhaging and had emergent D&E.  HBG on 7/10 was 8.7. On admissions, she became very dizzy with attempt for orthostatics, HR dropped to 30s, laid her back down.  She reports that her heart flutters occasionally.  Also continues with mild abdominal cramping and dark bleeding with a few tiny clots. Has chronic hypertension treated with Nifdimine   ER 30mg  qd.  Did not take this am because her BP was low.   Past Medical History:  Diagnosis Date  . Anemia   . Attention deficit disorder (ADD)   . Fever of unknown origin (FUO)   . Headache    Migaines history   . Hypertension   . PONV (postoperative nausea and vomiting)     Past Surgical History:  Procedure Laterality Date  . CESAREAN SECTION    . DIAPHRAGM SURGERY    . NISSEN FUNDOPLICATION    . SHOULDER SURGERY Right   . SPINAL CORD STIMULATOR INSERTION    . SPINE SURGERY     L5/S1  . TONSILLECTOMY      Family History  Problem Relation Age of Onset  . Cancer Father        skin  . Cancer Maternal Grandmother        breast  . Cancer Paternal Grandmother        lung    Social History  Substance Use Topics  . Smoking status: Never Smoker  . Smokeless tobacco: Never Used  . Alcohol use No    Allergies:  Allergies  Allergen Reactions  . Amoxicillin Anaphylaxis  . Penicillins Anaphylaxis    Has patient had a PCN reaction causing immediate  rash, facial/tongue/throat swelling, SOB or lightheadedness with hypotension: Yes Has patient had a PCN reaction causing severe rash involving mucus membranes or skin necrosis: No Has patient had a PCN reaction that required hospitalization: Yes Has patient had a PCN reaction occurring within the last 10 years: No If all of the above answers are "NO", then may proceed with Cephalosporin use.   Marland Kitchen. Apricot Flavor Hives and Swelling    Facial swelling    Prescriptions Prior to Admission  Medication Sig Dispense Refill Last Dose  . ibuprofen (ADVIL,MOTRIN) 600 MG tablet Take 1 tablet (600 mg total) by mouth every 6 (six) hours as needed (mild pain). 30 tablet 0   . oxyCODONE-acetaminophen (PERCOCET/ROXICET) 5-325 MG tablet Take 1-2 tablets by mouth every 4 (four) hours as needed (moderate to severe pain (when tolerating fluids)). 30 tablet 0   . Prenatal Vit-Fe Fumarate-FA (PRENATAL MULTIVITAMIN) TABS tablet Take 1 tablet by mouth daily at 12 noon.       Review of Systems  Constitutional: Positive for activity change (less) and fatigue.  Respiratory: Negative for chest tightness and shortness of breath.   Cardiovascular: Positive for palpitations.  Gastrointestinal:  Negative for abdominal pain, nausea and vomiting.  Genitourinary: Positive for pelvic pain (mild cramping) and vaginal bleeding (small amount).  Skin: Positive for pallor.  Neurological: Positive for dizziness, syncope and headaches (slight in the mornings, not at present).   Physical Exam   Blood pressure (!) 150/93, pulse 80, temperature 99.8 F (37.7 C), temperature source Oral, resp. rate 18, height 5\' 8"  (1.727 m), weight 231 lb (104.8 kg), SpO2 97 %, unknown if currently breastfeeding.  Physical Exam  Nursing note and vitals reviewed. Constitutional: She is oriented to person, place, and time. She appears well-developed.  Appears weak, slightly pale. Became orthostatic with initial evaluation.  Laid her back down.   HENT:  Head: Normocephalic.  Neck: Normal range of motion.  Respiratory: Effort normal. No respiratory distress.  Neurological: She is alert and oriented to person, place, and time.  Skin: There is pallor.  Psychiatric: She has a normal mood and affect. Her behavior is normal.   Results for orders placed or performed during the hospital encounter of 08/20/16 (from the past 24 hour(s))  Urinalysis, Routine w reflex microscopic     Status: Abnormal   Collection Time: 08/20/16  3:05 PM  Result Value Ref Range   Color, Urine RED (A) YELLOW   APPearance CLEAR CLEAR   Specific Gravity, Urine 1.001 (L) 1.005 - 1.030   pH 7.0 5.0 - 8.0   Glucose, UA NEGATIVE NEGATIVE mg/dL   Hgb urine dipstick LARGE (A) NEGATIVE   Bilirubin Urine NEGATIVE NEGATIVE   Ketones, ur NEGATIVE NEGATIVE mg/dL   Protein, ur 295 (A) NEGATIVE mg/dL   Nitrite NEGATIVE NEGATIVE   Leukocytes, UA NEGATIVE NEGATIVE  Urinalysis, Microscopic (reflex)     Status: Abnormal   Collection Time: 08/20/16  3:05 PM  Result Value Ref Range   RBC / HPF 6-30 0 - 5 RBC/hpf   WBC, UA 0-5 0 - 5 WBC/hpf   Bacteria, UA RARE (A) NONE SEEN   Squamous Epithelial / LPF 0-5 (A) NONE SEEN  CBC with Differential/Platelet     Status: Abnormal   Collection Time: 08/20/16  3:38 PM  Result Value Ref Range   WBC 11.9 (H) 4.0 - 10.5 K/uL   RBC 3.09 (L) 3.87 - 5.11 MIL/uL   Hemoglobin 8.6 (L) 12.0 - 15.0 g/dL   HCT 62.1 (L) 30.8 - 65.7 %   MCV 83.8 78.0 - 100.0 fL   MCH 27.8 26.0 - 34.0 pg   MCHC 33.2 30.0 - 36.0 g/dL   RDW 84.6 96.2 - 95.2 %   Platelets 356 150 - 400 K/uL   Neutrophils Relative % 67 %   Neutro Abs 8.0 (H) 1.7 - 7.7 K/uL   Lymphocytes Relative 27 %   Lymphs Abs 3.2 0.7 - 4.0 K/uL   Monocytes Relative 4 %   Monocytes Absolute 0.5 0.1 - 1.0 K/uL   Eosinophils Relative 2 %   Eosinophils Absolute 0.2 0.0 - 0.7 K/uL   Basophils Relative 0 %   Basophils Absolute 0.0 0.0 - 0.1 K/uL  Comprehensive metabolic panel     Status:  Abnormal   Collection Time: 08/20/16  3:38 PM  Result Value Ref Range   Sodium 138 135 - 145 mmol/L   Potassium 3.4 (L) 3.5 - 5.1 mmol/L   Chloride 103 101 - 111 mmol/L   CO2 24 22 - 32 mmol/L   Glucose, Bld 92 65 - 99 mg/dL   BUN 8 6 - 20 mg/dL   Creatinine, Ser 8.41  0.44 - 1.00 mg/dL   Calcium 9.1 8.9 - 16.1 mg/dL   Total Protein 6.3 (L) 6.5 - 8.1 g/dL   Albumin 3.3 (L) 3.5 - 5.0 g/dL   AST 25 15 - 41 U/L   ALT 15 14 - 54 U/L   Alkaline Phosphatase 44 38 - 126 U/L   Total Bilirubin 0.4 0.3 - 1.2 mg/dL   GFR calc non Af Amer >60 >60 mL/min   GFR calc Af Amer >60 >60 mL/min   Anion gap 11 5 - 15   MAU Course  Procedures  MDM MSE Exam Labs IV LR bolus EKG Orthostatic Vitals Rapid Response Called Called Dr. Dareen Piano to report MSE, lab results, EKG interpretation pending--message left on his phone for him to return my call. Consulted with Dr.Turner, Cardiologist on call for interpretation--she was unable to find EKG in Epic, copy faxed to her.  She will call back Dr. Dareen Piano called back at 4:40-report given.  To call him back after I get report from Dr. Mayford Knife.  16:57  Dr. Mayford Knife called back.  Reports patient is having PVCs, feels benign.  She recommends  with K of 3.4 should replace with K+, wait 2 hrs and repeat EKG and then call for cardiology consult Re; results Reported to Dr. Dareen Piano.  Discussed recommendations from Dr. Mayford Knife, he agreed.  Will call him back after second EKG report K+ replacement with 10 over 1 hr and repeat dose.  After completion wait 2 hrs, repeat EKG and call both cardiologist and Dr. Dareen Piano 17:00 care turned over to [facility]. Mayford Knife, CNM Patient is stable at the time of change of providers.  Patient updated on plan of care Assessment and Plan  A:  Post op D&E for fetal demise at 14 weeks, vaginal delivery and hemorrhage with retained placenta      Abnormal EKG=PVCs and prolonged QT      Dizzy      EKG      Hx hypertension     Dominique M  Hughes 08/20/2016, 4:33 PM   Assumed care Cardiac monitor shows persistent trigeminy with unifocal PVCs EKG had also shown prolonged QT First run of KCl in process Updated Dr Dareen Piano Will transfer to [facility] ED for closer cardiac monitoring  Dr Rosalia Hammers accepts care Will send via [facility]  Aviva Signs, CNM

## 2016-08-20 NOTE — ED Notes (Signed)
Lactated ringers wide open per dr j Lynelle Doctorknapp

## 2016-08-20 NOTE — MAU Note (Signed)
Pt had D&E on Tuesday due to bleeding. Pt stated she has not felt "right since" Has felt dizzy and shaky upon standing. Has tried to increase her fluids but has not relieved the symptoms.

## 2016-08-23 ENCOUNTER — Telehealth: Payer: Self-pay

## 2016-08-23 NOTE — Telephone Encounter (Signed)
SENT NOTES TO SCHEDULING 

## 2016-08-24 ENCOUNTER — Ambulatory Visit: Admit: 2016-08-24 | Payer: BLUE CROSS/BLUE SHIELD | Admitting: Obstetrics and Gynecology

## 2016-08-25 ENCOUNTER — Ambulatory Visit (HOSPITAL_COMMUNITY): Admit: 2016-08-25 | Payer: BLUE CROSS/BLUE SHIELD | Admitting: Obstetrics and Gynecology

## 2016-08-25 ENCOUNTER — Encounter: Payer: Self-pay | Admitting: Cardiology

## 2016-08-25 ENCOUNTER — Telehealth (HOSPITAL_COMMUNITY): Payer: Self-pay | Admitting: *Deleted

## 2016-08-25 ENCOUNTER — Ambulatory Visit (INDEPENDENT_AMBULATORY_CARE_PROVIDER_SITE_OTHER): Payer: BLUE CROSS/BLUE SHIELD | Admitting: Cardiology

## 2016-08-25 DIAGNOSIS — I10 Essential (primary) hypertension: Secondary | ICD-10-CM

## 2016-08-25 DIAGNOSIS — E876 Hypokalemia: Secondary | ICD-10-CM

## 2016-08-25 MED ORDER — AMLODIPINE BESYLATE 5 MG PO TABS: 2.5000 mg | ORAL_TABLET | Freq: Every day | ORAL | 6 refills | Status: DC

## 2016-08-25 NOTE — Progress Notes (Signed)
Cardiology Office Note:    Date:  08/25/2016   ID:  Dominique BruceElanie R Hughes, DOB May 23, 1980, MRN [id number]  PCP:  Sigmund HazelMiller, Lisa, MD  Cardiologist:  Garwin Brothersajan R Jayr Lupercio, MD   Referring MD: Marlow Baarslark, Dyanna, MD    ASSESSMENT:    1. Essential hypertension   2. Hypokalemia    PLAN:    In order of problems listed above:  1. The patient is mildly elevated. She has used amlodipine in the past. She has had history of essential hypertension even before her pregnancy. She's had a recent miscarriage and I believe that some of her blood pressure is coming from the recent events. Her husband accompanies her at this visit. I discussed these issues with her and at length and answered questions to their satisfaction. I have initiated amlodipine 5 mg half tablet daily. 2. She has history of hypokalemia and we will obtain a production level and a Chem-7 today. She will be taking the last dose of potassium today. We will keep you advised about this medication. 3. Echocardiogram will be done to assess murmur heard on auscultation. Patient will be seen in follow-up appointment in one month or earlier if she has any concerns. Shewould've to the nearest emergency room for any significant concerns  4.  Adjustments/Labs and Tests Ordered: Current medicines are reviewed at length with the patient today.  Concerns regarding medicines are outlined above.   echocardiogram and a Chem-7 has been ordered today   History of Present Illness:    Dominique Brucelanie R Hage is a 36 y.o. female who is being seen today for the evaluation of Essential hypertension at the request of Marlow Baarslark, Dyanna, MD. She recently had a miscarriage and mentions to me that she is referred here because of hypokalemia and hypertension. She was prescribed nifedipine but that has been stopped since this morning as she completed her medication course. No chest pain orthopnea or PND. Or Syncope. She significantly and at the time of my evaluation she denies any symptoms. She  just does not feel too well ever since she had this miscarriage and termination of pregnancy.   Past Medical History:  Diagnosis Date  . Anemia   . Attention deficit disorder (ADD)   . Fever of unknown origin (FUO)   . Headache    Migaines history   . Hypertension   . PONV (postoperative nausea and vomiting)     Past Surgical History:  Procedure Laterality Date  . CESAREAN SECTION    . DIAPHRAGM SURGERY    . NISSEN FUNDOPLICATION    . SHOULDER SURGERY Right   . SPINAL CORD STIMULATOR INSERTION    . SPINE SURGERY     L5/S1  . TONSILLECTOMY      Current Medications: Current Meds  Medication Sig  . ibuprofen (ADVIL,MOTRIN) 600 MG tablet Take 1 tablet (600 mg total) by mouth every 6 (six) hours as needed (mild pain).  . IRON PO Take 1 tablet by mouth daily. otc medication, not sure of dose  . oxyCODONE-acetaminophen (PERCOCET/ROXICET) 5-325 MG tablet Take 1-2 tablets by mouth every 4 (four) hours as needed (moderate to severe pain (when tolerating fluids)).  Marland Kitchen. potassium chloride SA (K-DUR,KLOR-CON) 20 MEQ tablet Take 1 tablet (20 mEq total) by mouth 2 (two) times daily.  . Prenatal Vit-Fe Fumarate-FA (PRENATAL MULTIVITAMIN) TABS tablet Take 1 tablet by mouth daily at 12 noon.     Allergies:   Amoxicillin; Penicillins; and Apricot flavor   Social History   Social History  .  Marital status: Single    Spouse name: N/A  . Number of children: N/A  . Years of education: N/A   Social History Main Topics  . Smoking status: Never Smoker  . Smokeless tobacco: Never Used  . Alcohol use No  . Drug use: No  . Sexual activity: Not Currently    Partners: Male   Other Topics Concern  . None   Social History Narrative  . None     Family History: The patient's family history includes Cancer in her father, maternal grandmother, and paternal grandmother; Heart disease in her father and mother; Heart failure in her father and mother; Hypertension in her father and mother.  ROS:    Please see the history of present illness.    All other systems reviewed and are negative.  EKGs/Labs/Other Studies Reviewed:    The following studies were reviewed today:I reviewed the hospital records including labs and she is significantly anemic.   Recent Labs: 08/20/2016: ALT 15; BUN 3; Creatinine, Ser 0.50; Hemoglobin 8.8; Magnesium 1.7; Platelets 356; Potassium 3.2; Sodium 142  Recent Lipid Panel No results found for: CHOL, TRIG, HDL, CHOLHDL, VLDL, LDLCALC, LDLDIRECT  Physical Exam:    VS:  BP (!) 168/86   Pulse (!) 101   Ht 5\' 8"  (1.727 m)   Wt 228 lb 0.6 oz (103.4 kg)   SpO2 98%   BMI 34.67 kg/m     Wt Readings from Last 3 Encounters:  08/25/16 228 lb 0.6 oz (103.4 kg)  08/20/16 231 lb (104.8 kg)  10/31/12 217 lb 4 oz (98.5 kg)     GEN: Patient is in no acute distress HEENT: Normal NECK: No JVD; No carotid bruits LYMPHATICS: No lymphadenopathy CARDIAC: S1 S2 regular, 2/6 systolic murmur at the apex. RESPIRATORY:  Clear to auscultation without rales, wheezing or rhonchi  ABDOMENAbdomen was not examined because of recent events. MUSCULOSKELETAL:  No edema; No deformity  SKIN: Warm and dry NEUROLOGIC:  Alert and oriented x 3 PSYCHIATRIC:  Normal affect    Signed, Garwin Brothers, MD  08/25/2016 9:16 AM    [facility]

## 2016-08-25 NOTE — Addendum Note (Signed)
Addended by: Rodney LangtonITTER, JADA M on: 08/25/2016 09:29 AM   Modules accepted: Orders

## 2016-08-25 NOTE — Patient Instructions (Signed)
Medication Instructions:  1.) Dr. Tomie Chinaevankar would like you to start taking amlodipine (Norvasc). This is a 5 mg tablet at which he wants you to start by taking only 1/2 tablet a day.   Labwork: Your physician recommends that you have lab work today: BMP  Testing/Procedures: Your physician has requested that you have an echocardiogram. Echocardiography is a painless test that uses sound waves to create images of your heart. It provides your doctor with information about the size and shape of your heart and how well your heart's chambers and valves are working. This procedure takes approximately one hour. There are no restrictions for this procedure.   Follow-Up: Your physician recommends that you schedule a follow-up appointment in: 1 month with Dr. Tomie Chinaevankar.   Any Other Special Instructions Will Be Listed Below (If Applicable).     If you need a refill on your cardiac medications before your next appointment, please call your pharmacy.

## 2016-08-26 ENCOUNTER — Ambulatory Visit (INDEPENDENT_AMBULATORY_CARE_PROVIDER_SITE_OTHER): Payer: BLUE CROSS/BLUE SHIELD | Admitting: Physician Assistant

## 2016-08-26 ENCOUNTER — Encounter: Payer: Self-pay | Admitting: Physician Assistant

## 2016-08-26 ENCOUNTER — Other Ambulatory Visit: Payer: Self-pay

## 2016-08-26 ENCOUNTER — Telehealth: Payer: Self-pay | Admitting: Cardiology

## 2016-08-26 VITALS — BP 138/98 | HR 84 | Temp 99.4°F | Resp 14 | Ht 68.5 in | Wt 228.0 lb

## 2016-08-26 DIAGNOSIS — R002 Palpitations: Secondary | ICD-10-CM

## 2016-08-26 DIAGNOSIS — E876 Hypokalemia: Secondary | ICD-10-CM | POA: Diagnosis not present

## 2016-08-26 DIAGNOSIS — D500 Iron deficiency anemia secondary to blood loss (chronic): Secondary | ICD-10-CM | POA: Diagnosis not present

## 2016-08-26 DIAGNOSIS — O034 Incomplete spontaneous abortion without complication: Secondary | ICD-10-CM

## 2016-08-26 DIAGNOSIS — O039 Complete or unspecified spontaneous abortion without complication: Secondary | ICD-10-CM | POA: Diagnosis not present

## 2016-08-26 LAB — CBC WITH DIFFERENTIAL/PLATELET
BASOS ABS: 0.1 10*3/uL (ref 0.0–0.1)
Basophils Relative: 0.5 % (ref 0.0–3.0)
Eosinophils Absolute: 0.3 10*3/uL (ref 0.0–0.7)
Eosinophils Relative: 2.4 % (ref 0.0–5.0)
HCT: 29.5 % — ABNORMAL LOW (ref 36.0–46.0)
Hemoglobin: 9.7 g/dL — ABNORMAL LOW (ref 12.0–15.0)
Lymphocytes Relative: 23 % (ref 12.0–46.0)
Lymphs Abs: 2.8 10*3/uL (ref 0.7–4.0)
MCHC: 33 g/dL (ref 30.0–36.0)
MCV: 84 fl (ref 78.0–100.0)
MONO ABS: 0.6 10*3/uL (ref 0.1–1.0)
Monocytes Relative: 4.9 % (ref 3.0–12.0)
NEUTROS ABS: 8.3 10*3/uL — AB (ref 1.4–7.7)
NEUTROS PCT: 69.2 % (ref 43.0–77.0)
PLATELETS: 517 10*3/uL — AB (ref 150.0–400.0)
RBC: 3.51 Mil/uL — AB (ref 3.87–5.11)
RDW: 14.7 % (ref 11.5–15.5)
WBC: 12 10*3/uL — ABNORMAL HIGH (ref 4.0–10.5)

## 2016-08-26 LAB — BASIC METABOLIC PANEL
BUN/Creatinine Ratio: 12 (ref 9–23)
BUN: 9 mg/dL (ref 6–20)
CHLORIDE: 102 mmol/L (ref 96–106)
CO2: 21 mmol/L (ref 20–29)
Calcium: 10 mg/dL (ref 8.7–10.2)
Creatinine, Ser: 0.75 mg/dL (ref 0.57–1.00)
GFR, EST AFRICAN AMERICAN: 120 mL/min/{1.73_m2} (ref >59)
GFR, EST NON AFRICAN AMERICAN: 104 mL/min/{1.73_m2} (ref >59)
Glucose: 91 mg/dL (ref 65–99)
POTASSIUM: 4.8 mmol/L (ref 3.5–5.2)
SODIUM: 141 mmol/L (ref 134–144)

## 2016-08-26 MED ORDER — POTASSIUM CHLORIDE CRYS ER 20 MEQ PO TBCR: 20.0000 meq | EXTENDED_RELEASE_TABLET | Freq: Two times a day (BID) | ORAL | 1 refills | Status: DC

## 2016-08-26 NOTE — Progress Notes (Signed)
Pre visit review using our clinic review tool, if applicable. No additional management support is needed unless otherwise documented below in the visit note. 

## 2016-08-26 NOTE — Telephone Encounter (Signed)
DONE! Made mistake and printed the Rx. It is now fixed and Pharmacy should have received the refill request.

## 2016-08-26 NOTE — Progress Notes (Signed)
Patient presents to clinic today to establish care and as an ER follow-up. Patient presented to ER on 08/20/16 with complaints of lightheadedness and dizziness days s/p a dilation and evacuation. Associated symptoms included palpitations. ER workup included EKG revealing PVCs, labs that revealed anemia with hemoglobin at 8.6, hypokalemia with potassium at 3.4. Patient also noted to have a prolonged QT interval without arrhythmia. Was given IV fluids and potassium repletion in ER. Was sent home on Iron supplement with Cardiology follow-up and encouraged to establish with a PCP.  Patient was evaluated by Cardiology on 08/25/16, at which time hypokalemia was reassessed and noted to be resolved. BP significantly elevated at that visit. Patient was started on amlodipine 2.5 mg daily. Echocardiogram ordered due to 2/6 systolic apical murmur auscultated on exam. Study is pending.   Patient endorses lightheadedness with positional changes, mainly when going from sitting to standing. Denies racing heart. Still having occasional episode of palpitations, mainly at night. Denies chest pain, shortness of breath. Is staying well hydrated. Eating a well-balanced diet but states appetite has not been the best. Endorses good urinary output and bowel movements. Denies melena, tenesmus or hematochezia. Still noted fatigue. Denies new or worsening symptoms.  Past Medical History:  Diagnosis Date  . Anemia   . Attention deficit disorder (ADD)   . Fever of unknown origin (FUO)   . Headache    Migaines history   . Hypertension   . PONV (postoperative nausea and vomiting)     Past Surgical History:  Procedure Laterality Date  . CESAREAN SECTION    . DIAPHRAGM SURGERY    . NISSEN FUNDOPLICATION    . SHOULDER SURGERY Right   . SPINAL CORD STIMULATOR INSERTION    . SPINE SURGERY     L5/S1  . TONSILLECTOMY      Current Outpatient Prescriptions on File Prior to Visit  Medication Sig Dispense Refill  . amLODipine  (NORVASC) 5 MG tablet Take 0.5 tablets (2.5 mg total) by mouth daily. 30 tablet 6  . ibuprofen (ADVIL,MOTRIN) 600 MG tablet Take 1 tablet (600 mg total) by mouth every 6 (six) hours as needed (mild pain). 30 tablet 0  . IRON PO Take 1 tablet by mouth daily. otc medication, not sure of dose    . Prenatal Vit-Fe Fumarate-FA (PRENATAL MULTIVITAMIN) TABS tablet Take 1 tablet by mouth daily at 12 noon.     No current facility-administered medications on file prior to visit.     Allergies  Allergen Reactions  . Amoxicillin Anaphylaxis  . Penicillins Anaphylaxis    Has patient had a PCN reaction causing immediate rash, facial/tongue/throat swelling, SOB or lightheadedness with hypotension: Yes Has patient had a PCN reaction causing severe rash involving mucus membranes or skin necrosis: No Has patient had a PCN reaction that required hospitalization: Yes Has patient had a PCN reaction occurring within the last 10 years: No If all of the above answers are "NO", then may proceed with Cephalosporin use.   Marland Kitchen Apricot Flavor Hives and Swelling    Facial swelling    Family History  Problem Relation Age of Onset  . Hypertension Mother   . Heart disease Mother        Mitral Valve  . Heart failure Mother   . Cancer Father        skin  . Hypertension Father   . Heart disease Father        Cardiac Stent  . Heart failure Father   . Cancer Maternal  Grandmother        breast  . Cancer Paternal Grandmother        lung    Social History   Social History  . Marital status: Single    Spouse name: N/A  . Number of children: N/A  . Years of education: N/A   Occupational History  . Not on file.   Social History Main Topics  . Smoking status: Never Smoker  . Smokeless tobacco: Never Used  . Alcohol use No  . Drug use: No  . Sexual activity: Not Currently    Partners: Male   Other Topics Concern  . Not on file   Social History Narrative  . No narrative on file   Review of Systems    Constitutional: Positive for fever and malaise/fatigue.  HENT: Negative for hearing loss and tinnitus.   Eyes: Negative for blurred vision.  Respiratory: Negative for cough and shortness of breath.   Cardiovascular: Negative for chest pain.  Gastrointestinal: Negative for heartburn and nausea.  Genitourinary: Negative for dysuria, flank pain, frequency, hematuria and urgency.  Musculoskeletal: Negative for back pain, falls, joint pain, myalgias and neck pain.  Neurological: Negative for dizziness, tingling, loss of consciousness and headaches.       + lightheadedness  Psychiatric/Behavioral: Negative for depression, hallucinations, substance abuse and suicidal ideas. The patient is not nervous/anxious and does not have insomnia.    BP (!) 138/98   Pulse 84   Temp 99.4 F (37.4 C) (Oral)   Resp 14   Ht 5' 8.5" (1.74 m)   Wt 228 lb (103.4 kg)   SpO2 98%   BMI 34.16 kg/m   Physical Exam  Constitutional: She is oriented to person, place, and time and well-developed, well-nourished, and in no distress.  HENT:  Head: Normocephalic and atraumatic.  Right Ear: External ear normal.  Left Ear: External ear normal.  Nose: Nose normal.  Mouth/Throat: Oropharynx is clear and moist. No oropharyngeal exudate.  Eyes: Conjunctivae are normal.  Neck: Neck supple.  Cardiovascular: Normal rate, regular rhythm, normal heart sounds and intact distal pulses.   Pulmonary/Chest: Effort normal and breath sounds normal. No respiratory distress. She has no wheezes. She has no rales. She exhibits no tenderness.  Lymphadenopathy:    She has no cervical adenopathy.  Neurological: She is alert and oriented to person, place, and time. No cranial nerve deficit.  Skin: Skin is warm and dry. No rash noted.  Psychiatric: Affect normal.  Vitals reviewed.  Recent Results (from the past 2160 hour(s))  Protein / creatinine ratio, urine     Status: None   Collection Time: 08/16/16 10:55 PM  Result Value Ref  Range   Creatinine, Urine 195.00 mg/dL   Total Protein, Urine 21 mg/dL    Comment: NO NORMAL RANGE ESTABLISHED FOR THIS TEST   Protein Creatinine Ratio 0.11 0.00 - 0.15 mg/mg[Cre]  Type and screen [place]     Status: None   Collection Time: 08/16/16 11:20 PM  Result Value Ref Range   ABO/RH(D) A POS    Antibody Screen NEG    Sample Expiration 08/19/2016    Unit Number [id number]    Blood Component Type RED CELLS,LR    Unit division 00    Status of Unit REL FROM [facility]    Transfusion Status OK TO TRANSFUSE    Crossmatch Result Compatible    Unit Number [id number]    Blood Component Type RED CELLS,LR    Unit division 00  Status of Unit REL FROM [facility]    Transfusion Status OK TO TRANSFUSE    Crossmatch Result Compatible   CBC     Status: Abnormal   Collection Time: 08/16/16 11:20 PM  Result Value Ref Range   WBC 14.1 (H) 4.0 - 10.5 K/uL   RBC 4.29 3.87 - 5.11 MIL/uL   Hemoglobin 11.8 (L) 12.0 - 15.0 g/dL   HCT 35.8 (L) 36.0 - 46.0 %   MCV 83.4 78.0 - 100.0 fL   MCH 27.5 26.0 - 34.0 pg   MCHC 33.0 30.0 - 36.0 g/dL   RDW 14.9 11.5 - 15.5 %   Platelets 336 150 - 400 K/uL  BPAM RBC     Status: None   Collection Time: 08/16/16 11:20 PM  Result Value Ref Range   ISSUE DATE / TIME [id number]    Blood Product Unit Number [id number]    PRODUCT CODE E0336V00    Unit Type and Rh 6200    Blood Product Expiration Date 940768088110    ISSUE DATE / TIME [id number]    Blood Product Unit Number [id number]    PRODUCT CODE E0336V00    Unit Type and Rh 6579    Blood Product Expiration Date [id number]   RPR     Status: None   Collection Time: 08/16/16 11:56 PM  Result Value Ref Range   RPR Ser Ql Non Reactive Non Reactive    Comment: (NOTE) Performed At: [facility] [place], [place] [id number] Lindon Romp MD [id number]   Prepare RBC     Status: None   Collection Time: 08/17/16 12:00 AM  Result Value Ref Range    Order Confirmation ORDER PROCESSED BY BLOOD BANK   CBC     Status: Abnormal   Collection Time: 08/17/16 12:55 AM  Result Value Ref Range   WBC 17.2 (H) 4.0 - 10.5 K/uL   RBC 3.41 (L) 3.87 - 5.11 MIL/uL   Hemoglobin 9.5 (L) 12.0 - 15.0 g/dL   HCT 29.1 (L) 36.0 - 46.0 %   MCV 85.3 78.0 - 100.0 fL   MCH 27.9 26.0 - 34.0 pg   MCHC 32.6 30.0 - 36.0 g/dL   RDW 14.9 11.5 - 15.5 %   Platelets 282 150 - 400 K/uL  Comprehensive metabolic panel     Status: Abnormal   Collection Time: 08/17/16 12:55 AM  Result Value Ref Range   Sodium 138 135 - 145 mmol/L   Potassium 3.8 3.5 - 5.1 mmol/L   Chloride 106 101 - 111 mmol/L   CO2 25 22 - 32 mmol/L   Glucose, Bld 119 (H) 65 - 99 mg/dL   BUN 8 6 - 20 mg/dL   Creatinine, Ser 0.70 0.44 - 1.00 mg/dL   Calcium 8.8 (L) 8.9 - 10.3 mg/dL   Total Protein 5.6 (L) 6.5 - 8.1 g/dL   Albumin 2.8 (L) 3.5 - 5.0 g/dL   AST 12 (L) 15 - 41 U/L   ALT 9 (L) 14 - 54 U/L   Alkaline Phosphatase 39 38 - 126 U/L   Total Bilirubin 0.3 0.3 - 1.2 mg/dL   GFR calc non Af Amer >60 >60 mL/min   GFR calc Af Amer >60 >60 mL/min    Comment: (NOTE) The eGFR has been calculated using the CKD EPI equation. This calculation has not been validated in all clinical situations. eGFR's persistently <60 mL/min signify possible Chronic Kidney Disease.    Anion gap 7 5 - 15  CBC     Status: Abnormal   Collection Time: 08/17/16  7:59 AM  Result Value Ref Range   WBC 15.9 (H) 4.0 - 10.5 K/uL   RBC 3.11 (L) 3.87 - 5.11 MIL/uL   Hemoglobin 8.7 (L) 12.0 - 15.0 g/dL   HCT 26.0 (L) 36.0 - 46.0 %   MCV 83.6 78.0 - 100.0 fL   MCH 28.0 26.0 - 34.0 pg   MCHC 33.5 30.0 - 36.0 g/dL   RDW 15.0 11.5 - 15.5 %   Platelets 287 150 - 400 K/uL  ABO/Rh     Status: None   Collection Time: 08/17/16 11:20 PM  Result Value Ref Range   ABO/RH(D) A POS   Urinalysis, Routine w reflex microscopic     Status: Abnormal   Collection Time: 08/20/16  3:05 PM  Result Value Ref Range   Color, Urine RED  (A) YELLOW    Comment: BIOCHEMICALS MAY BE AFFECTED BY COLOR   APPearance CLEAR CLEAR   Specific Gravity, Urine 1.001 (L) 1.005 - 1.030   pH 7.0 5.0 - 8.0   Glucose, UA NEGATIVE NEGATIVE mg/dL   Hgb urine dipstick LARGE (A) NEGATIVE   Bilirubin Urine NEGATIVE NEGATIVE   Ketones, ur NEGATIVE NEGATIVE mg/dL   Protein, ur 100 (A) NEGATIVE mg/dL   Nitrite NEGATIVE NEGATIVE   Leukocytes, UA NEGATIVE NEGATIVE  Urinalysis, Microscopic (reflex)     Status: Abnormal   Collection Time: 08/20/16  3:05 PM  Result Value Ref Range   RBC / HPF 6-30 0 - 5 RBC/hpf   WBC, UA 0-5 0 - 5 WBC/hpf   Bacteria, UA RARE (A) NONE SEEN   Squamous Epithelial / LPF 0-5 (A) NONE SEEN  CBC with Differential/Platelet     Status: Abnormal   Collection Time: 08/20/16  3:38 PM  Result Value Ref Range   WBC 11.9 (H) 4.0 - 10.5 K/uL   RBC 3.09 (L) 3.87 - 5.11 MIL/uL   Hemoglobin 8.6 (L) 12.0 - 15.0 g/dL   HCT 25.9 (L) 36.0 - 46.0 %   MCV 83.8 78.0 - 100.0 fL   MCH 27.8 26.0 - 34.0 pg   MCHC 33.2 30.0 - 36.0 g/dL   RDW 14.8 11.5 - 15.5 %   Platelets 356 150 - 400 K/uL   Neutrophils Relative % 67 %   Neutro Abs 8.0 (H) 1.7 - 7.7 K/uL   Lymphocytes Relative 27 %   Lymphs Abs 3.2 0.7 - 4.0 K/uL   Monocytes Relative 4 %   Monocytes Absolute 0.5 0.1 - 1.0 K/uL   Eosinophils Relative 2 %   Eosinophils Absolute 0.2 0.0 - 0.7 K/uL   Basophils Relative 0 %   Basophils Absolute 0.0 0.0 - 0.1 K/uL  Comprehensive metabolic panel     Status: Abnormal   Collection Time: 08/20/16  3:38 PM  Result Value Ref Range   Sodium 138 135 - 145 mmol/L   Potassium 3.4 (L) 3.5 - 5.1 mmol/L   Chloride 103 101 - 111 mmol/L   CO2 24 22 - 32 mmol/L   Glucose, Bld 92 65 - 99 mg/dL   BUN 8 6 - 20 mg/dL   Creatinine, Ser 0.67 0.44 - 1.00 mg/dL   Calcium 9.1 8.9 - 10.3 mg/dL   Total Protein 6.3 (L) 6.5 - 8.1 g/dL   Albumin 3.3 (L) 3.5 - 5.0 g/dL   AST 25 15 - 41 U/L   ALT 15 14 - 54 U/L   Alkaline Phosphatase 44  38 - 126 U/L   Total  Bilirubin 0.4 0.3 - 1.2 mg/dL   GFR calc non Af Amer >60 >60 mL/min   GFR calc Af Amer >60 >60 mL/min    Comment: (NOTE) The eGFR has been calculated using the CKD EPI equation. This calculation has not been validated in all clinical situations. eGFR's persistently <60 mL/min signify possible Chronic Kidney Disease.    Anion gap 11 5 - 15  Magnesium     Status: None   Collection Time: 08/20/16  9:11 PM  Result Value Ref Range   Magnesium 1.7 1.7 - 2.4 mg/dL  I-stat chem 8, ed     Status: Abnormal   Collection Time: 08/20/16  9:24 PM  Result Value Ref Range   Sodium 142 135 - 145 mmol/L   Potassium 3.2 (L) 3.5 - 5.1 mmol/L   Chloride 103 101 - 111 mmol/L   BUN 3 (L) 6 - 20 mg/dL   Creatinine, Ser 0.50 0.44 - 1.00 mg/dL   Glucose, Bld 107 (H) 65 - 99 mg/dL   Calcium, Ion 1.15 1.15 - 1.40 mmol/L   TCO2 25 0 - 100 mmol/L   Hemoglobin 8.8 (L) 12.0 - 15.0 g/dL   HCT 26.0 (L) 36.0 - 94.5 %  Basic metabolic panel     Status: None   Collection Time: 08/25/16  9:52 AM  Result Value Ref Range   Glucose 91 65 - 99 mg/dL   BUN 9 6 - 20 mg/dL   Creatinine, Ser 0.75 0.57 - 1.00 mg/dL   GFR calc non Af Amer 104 >59 mL/min/1.73   GFR calc Af Amer 120 >59 mL/min/1.73   BUN/Creatinine Ratio 12 9 - 23   Sodium 141 134 - 144 mmol/L   Potassium 4.8 3.5 - 5.2 mmol/L   Chloride 102 96 - 106 mmol/L   CO2 21 20 - 29 mmol/L   Calcium 10.0 8.7 - 10.2 mg/dL  CBC w/Diff     Status: Abnormal   Collection Time: 08/26/16 11:41 AM  Result Value Ref Range   WBC 12.0 (H) 4.0 - 10.5 K/uL   RBC 3.51 (L) 3.87 - 5.11 Mil/uL   Hemoglobin 9.7 (L) 12.0 - 15.0 g/dL   HCT 29.5 (L) 36.0 - 46.0 %   MCV 84.0 78.0 - 100.0 fl   MCHC 33.0 30.0 - 36.0 g/dL   RDW 14.7 11.5 - 15.5 %   Platelets 517.0 (H) 150.0 - 400.0 K/uL   Neutrophils Relative % 69.2 43.0 - 77.0 %   Lymphocytes Relative 23.0 12.0 - 46.0 %   Monocytes Relative 4.9 3.0 - 12.0 %   Eosinophils Relative 2.4 0.0 - 5.0 %   Basophils Relative 0.5 0.0 -  3.0 %   Neutro Abs 8.3 (H) 1.4 - 7.7 K/uL   Lymphs Abs 2.8 0.7 - 4.0 K/uL   Monocytes Absolute 0.6 0.1 - 1.0 K/uL   Eosinophils Absolute 0.3 0.0 - 0.7 K/uL   Basophils Absolute 0.1 0.0 - 0.1 K/uL   Assessment/Plan: Palpitations Followed by Cardiology. Occurring at night and are not sustained. PVC's noted on prior workup. Recommend Holter study. Order placed. Strict ER precautions given to patient.   Inevitable spontaneous abortion S/p D and E resulting in hemorrhage. Patient has follow-up with Gynecology scheduled in the next week. Supportive measures review. Will continue treatment and assessment of acute blood loss anemia.   Hypokalemia Resolved.  Blood loss anemia Secondary to hemorhrage during D/E. Is on iron supplementation. No significant bleeding  at present. Will repeat CBC and alter regimen accordingly. Will start compression stockings for positional lightheadedness secondary to anemia. Close follow-up scheduled.     Leeanne Rio, PA-C

## 2016-08-26 NOTE — Patient Instructions (Signed)
Please stay well hydrated and get plenty of rest. Continue medications as directed by specialist. I do want you to increase you iron supplement to 1 tablet twice daily. Wear the compression stockings during the day short term -- this will help BP stay stable when you are changing positions.   I am reassessing anemia and white blood counts today to make sure things are improving.   You will be contacted for a holter monitor study. If there are any worsening symptoms, please go to the ER.

## 2016-08-26 NOTE — Telephone Encounter (Signed)
Pt Informed of normal lab results, and Rx sent to pharmacy.

## 2016-08-26 NOTE — Telephone Encounter (Signed)
Call potassium in again, [facility] is stating they don't have it

## 2016-08-27 ENCOUNTER — Encounter: Payer: Self-pay | Admitting: Emergency Medicine

## 2016-08-31 ENCOUNTER — Telehealth: Payer: Self-pay | Admitting: Physician Assistant

## 2016-08-31 DIAGNOSIS — R002 Palpitations: Secondary | ICD-10-CM | POA: Insufficient documentation

## 2016-08-31 DIAGNOSIS — D500 Iron deficiency anemia secondary to blood loss (chronic): Secondary | ICD-10-CM | POA: Insufficient documentation

## 2016-08-31 NOTE — Assessment & Plan Note (Signed)
Followed by Cardiology. Occurring at night and are not sustained. PVC's noted on prior workup. Recommend Holter study. Order placed. Strict ER precautions given to patient.

## 2016-08-31 NOTE — Assessment & Plan Note (Signed)
S/p D and E resulting in hemorrhage. Patient has follow-up with Gynecology scheduled in the next week. Supportive measures review. Will continue treatment and assessment of acute blood loss anemia.

## 2016-08-31 NOTE — Telephone Encounter (Signed)
Forms have been given to me this afternoon.  They will be completed by tomorrow morning and faxed in. Ok to call patient tomorrow morning to let her know they have been taken care of.

## 2016-08-31 NOTE — Telephone Encounter (Signed)
Received fax from pt regarding FMLA, placed in bin up front with charge sheet.

## 2016-08-31 NOTE — Assessment & Plan Note (Signed)
Resolved

## 2016-08-31 NOTE — Telephone Encounter (Signed)
Forms have been completed and faxed. Please inform patient.

## 2016-08-31 NOTE — Assessment & Plan Note (Signed)
Secondary to hemorhrage during D/E. Is on iron supplementation. No significant bleeding at present. Will repeat CBC and alter regimen accordingly. Will start compression stockings for positional lightheadedness secondary to anemia. Close follow-up scheduled.

## 2016-08-31 NOTE — Telephone Encounter (Signed)
Patient requesting call back to be notified forms have been completed.  Please call her back at [phone].

## 2016-09-01 NOTE — Telephone Encounter (Signed)
Patient advised that paperwork was completed and faxed.

## 2016-09-03 ENCOUNTER — Ambulatory Visit (INDEPENDENT_AMBULATORY_CARE_PROVIDER_SITE_OTHER): Payer: BLUE CROSS/BLUE SHIELD | Admitting: Physician Assistant

## 2016-09-03 ENCOUNTER — Encounter: Payer: Self-pay | Admitting: Physician Assistant

## 2016-09-03 VITALS — BP 130/88 | HR 83 | Temp 99.1°F | Resp 14 | Ht 69.0 in | Wt 233.0 lb

## 2016-09-03 DIAGNOSIS — R42 Dizziness and giddiness: Secondary | ICD-10-CM | POA: Diagnosis not present

## 2016-09-03 DIAGNOSIS — D62 Acute posthemorrhagic anemia: Secondary | ICD-10-CM | POA: Diagnosis not present

## 2016-09-03 LAB — CBC WITH DIFFERENTIAL/PLATELET
BASOS PCT: 0.7 % (ref 0.0–3.0)
Basophils Absolute: 0.1 10*3/uL (ref 0.0–0.1)
EOS ABS: 0.3 10*3/uL (ref 0.0–0.7)
Eosinophils Relative: 3.2 % (ref 0.0–5.0)
HCT: 31.2 % — ABNORMAL LOW (ref 36.0–46.0)
HEMOGLOBIN: 10.1 g/dL — AB (ref 12.0–15.0)
Lymphocytes Relative: 21.9 % (ref 12.0–46.0)
Lymphs Abs: 1.7 10*3/uL (ref 0.7–4.0)
MCHC: 32.3 g/dL (ref 30.0–36.0)
MCV: 83.6 fl (ref 78.0–100.0)
MONO ABS: 0.4 10*3/uL (ref 0.1–1.0)
Monocytes Relative: 5.7 % (ref 3.0–12.0)
NEUTROS ABS: 5.4 10*3/uL (ref 1.4–7.7)
Neutrophils Relative %: 68.5 % (ref 43.0–77.0)
PLATELETS: 406 10*3/uL — AB (ref 150.0–400.0)
RBC: 3.73 Mil/uL — ABNORMAL LOW (ref 3.87–5.11)
RDW: 14.5 % (ref 11.5–15.5)
WBC: 7.9 10*3/uL (ref 4.0–10.5)

## 2016-09-03 LAB — COMPREHENSIVE METABOLIC PANEL
ALBUMIN: 4 g/dL (ref 3.5–5.2)
ALK PHOS: 54 U/L (ref 39–117)
ALT: 11 U/L (ref 0–35)
AST: 10 U/L (ref 0–37)
BUN: 8 mg/dL (ref 6–23)
CO2: 25 mEq/L (ref 19–32)
Calcium: 9.7 mg/dL (ref 8.4–10.5)
Chloride: 106 mEq/L (ref 96–112)
Creatinine, Ser: 0.61 mg/dL (ref 0.40–1.20)
GFR: 119.07 mL/min (ref >60.00)
Glucose, Bld: 98 mg/dL (ref 70–99)
POTASSIUM: 4.5 meq/L (ref 3.5–5.1)
Sodium: 140 mEq/L (ref 135–145)
TOTAL PROTEIN: 6.5 g/dL (ref 6.0–8.3)
Total Bilirubin: 0.3 mg/dL (ref 0.2–1.2)

## 2016-09-03 NOTE — Patient Instructions (Addendum)
Please stay well-hydrated. Get plenty of rest.  We are rechecking labs to make sure all parameters are improving.  Follow-up with Cardiology as scheduled Monday. If you note any recurrence of symptoms over the weekend, please go to the ER.  I will call you this afternoon with results and with GYN recommendations.

## 2016-09-03 NOTE — Progress Notes (Signed)
Patient presents to clinic today to follow-up regarding anemia, fatigue and positional lightheadedness. Since last visit, patient endorses feeling better overall. Energy is returning. Is still having occasional lightheadedness on standing but is much milder and less frequent than before. Is staying well hydrated, eating a well-balanced diet, and trying to get plenty of rest. Did note recurrence of vaginal bleeding 2-3 days ago that was very heavy, after which she noted increased fatigue and lightheadedness. States this has resolved but she has been having 1-2 episodes of drop in BP and heart rate suddenly and lasting several hours. She is currently on Amlodipine 2.5 mg daily, prescribed by Cardiology. Denies recurrence of palpitations since last visit. Holter monitor has not been placed. Patient notes continued pelvic tenderness with low-grade fever < 100 occurring daily. States pain is improving daily but still present. Is not scheduled to follow-up with GYN until 2 weeks from now.  Patient is asymptomatic today.  Past Medical History:  Diagnosis Date  . Anemia   . Attention deficit disorder (ADD)   . Fever of unknown origin (FUO)   . Headache    Migaines history   . Hypertension   . PONV (postoperative nausea and vomiting)     Current Outpatient Prescriptions on File Prior to Visit  Medication Sig Dispense Refill  . amLODipine (NORVASC) 5 MG tablet Take 0.5 tablets (2.5 mg total) by mouth daily. 30 tablet 6  . ibuprofen (ADVIL,MOTRIN) 600 MG tablet Take 1 tablet (600 mg total) by mouth every 6 (six) hours as needed (mild pain). 30 tablet 0  . IRON PO Take 1 tablet by mouth daily. otc medication, not sure of dose    . potassium chloride SA (K-DUR,KLOR-CON) 20 MEQ tablet Take 1 tablet (20 mEq total) by mouth 2 (two) times daily. 60 tablet 1  . Prenatal Vit-Fe Fumarate-FA (PRENATAL MULTIVITAMIN) TABS tablet Take 1 tablet by mouth daily at 12 noon.     No current facility-administered  medications on file prior to visit.     Allergies  Allergen Reactions  . Amoxicillin Anaphylaxis  . Penicillins Anaphylaxis    Has patient had a PCN reaction causing immediate rash, facial/tongue/throat swelling, SOB or lightheadedness with hypotension: Yes Has patient had a PCN reaction causing severe rash involving mucus membranes or skin necrosis: No Has patient had a PCN reaction that required hospitalization: Yes Has patient had a PCN reaction occurring within the last 10 years: No If all of the above answers are "NO", then may proceed with Cephalosporin use.   Marland Kitchen Apricot Flavor Hives and Swelling    Facial swelling    Family History  Problem Relation Age of Onset  . Hypertension Mother   . Heart disease Mother        Mitral Valve  . Heart failure Mother   . Cancer Father        skin  . Hypertension Father   . Heart disease Father        Cardiac Stent  . Heart failure Father   . Cancer Maternal Grandmother        breast  . Cancer Paternal Grandmother        lung    Social History   Social History  . Marital status: Single    Spouse name: N/A  . Number of children: N/A  . Years of education: N/A   Social History Main Topics  . Smoking status: Never Smoker  . Smokeless tobacco: Never Used  . Alcohol use No  .  Drug use: No  . Sexual activity: Not Currently    Partners: Male   Other Topics Concern  . None   Social History Narrative  . None   Review of Systems - See HPI.  All other ROS are negative.  BP 130/88   Pulse 83   Temp 99.1 F (37.3 C) (Oral)   Resp 14   Ht 5' 9"  (1.753 m)   Wt 233 lb (105.7 kg)   SpO2 98%   BMI 34.41 kg/m   Physical Exam  Constitutional: She is oriented to person, place, and time and well-developed, well-nourished, and in no distress.  HENT:  Head: Normocephalic and atraumatic.  Eyes: Pupils are equal, round, and reactive to light. Conjunctivae are normal.  Neck: Neck supple.  Cardiovascular: Normal rate, normal  heart sounds and intact distal pulses.   Pulmonary/Chest: Effort normal and breath sounds normal. No respiratory distress. She has no wheezes. She has no rales. She exhibits no tenderness.  Abdominal: Soft. Bowel sounds are normal. There is tenderness (very mild on deep palpation) in the suprapubic area.  Lymphadenopathy:    She has no cervical adenopathy.  Neurological: She is alert and oriented to person, place, and time. No cranial nerve deficit.  Skin: Skin is warm and dry. No rash noted.  Psychiatric: Affect normal.  Vitals reviewed.   Recent Results (from the past 2160 hour(s))  Protein / creatinine ratio, urine     Status: None   Collection Time: 08/16/16 10:55 PM  Result Value Ref Range   Creatinine, Urine 195.00 mg/dL   Total Protein, Urine 21 mg/dL    Comment: NO NORMAL RANGE ESTABLISHED FOR THIS TEST   Protein Creatinine Ratio 0.11 0.00 - 0.15 mg/mg[Cre]  Type and screen [place]     Status: None   Collection Time: 08/16/16 11:20 PM  Result Value Ref Range   ABO/RH(D) A POS    Antibody Screen NEG    Sample Expiration 08/19/2016    Unit Number [id number]    Blood Component Type RED CELLS,LR    Unit division 00    Status of Unit REL FROM [facility]    Transfusion Status OK TO TRANSFUSE    Crossmatch Result Compatible    Unit Number [id number]    Blood Component Type RED CELLS,LR    Unit division 00    Status of Unit REL FROM [facility]    Transfusion Status OK TO TRANSFUSE    Crossmatch Result Compatible   CBC     Status: Abnormal   Collection Time: 08/16/16 11:20 PM  Result Value Ref Range   WBC 14.1 (H) 4.0 - 10.5 K/uL   RBC 4.29 3.87 - 5.11 MIL/uL   Hemoglobin 11.8 (L) 12.0 - 15.0 g/dL   HCT 35.8 (L) 36.0 - 46.0 %   MCV 83.4 78.0 - 100.0 fL   MCH 27.5 26.0 - 34.0 pg   MCHC 33.0 30.0 - 36.0 g/dL   RDW 14.9 11.5 - 15.5 %   Platelets 336 150 - 400 K/uL  BPAM RBC     Status: None   Collection Time: 08/16/16 11:20 PM  Result Value Ref Range     ISSUE DATE / TIME [id number]    Blood Product Unit Number [id number]    PRODUCT CODE E0336V00    Unit Type and Rh 2725    Blood Product Expiration Date 366440347425    ISSUE DATE / TIME [id number]    Blood Product Unit Number [id number]  PRODUCT CODE E0336V00    Unit Type and Rh 6200    Blood Product Expiration Date [id number]   RPR     Status: None   Collection Time: 08/16/16 11:56 PM  Result Value Ref Range   RPR Ser Ql Non Reactive Non Reactive    Comment: (NOTE) Performed At: [facility] [place], [place] [id number] Lindon Romp MD MA:[id number]   Prepare RBC     Status: None   Collection Time: 08/17/16 12:00 AM  Result Value Ref Range   Order Confirmation ORDER PROCESSED BY BLOOD BANK   CBC     Status: Abnormal   Collection Time: 08/17/16 12:55 AM  Result Value Ref Range   WBC 17.2 (H) 4.0 - 10.5 K/uL   RBC 3.41 (L) 3.87 - 5.11 MIL/uL   Hemoglobin 9.5 (L) 12.0 - 15.0 g/dL   HCT 29.1 (L) 36.0 - 46.0 %   MCV 85.3 78.0 - 100.0 fL   MCH 27.9 26.0 - 34.0 pg   MCHC 32.6 30.0 - 36.0 g/dL   RDW 14.9 11.5 - 15.5 %   Platelets 282 150 - 400 K/uL  Comprehensive metabolic panel     Status: Abnormal   Collection Time: 08/17/16 12:55 AM  Result Value Ref Range   Sodium 138 135 - 145 mmol/L   Potassium 3.8 3.5 - 5.1 mmol/L   Chloride 106 101 - 111 mmol/L   CO2 25 22 - 32 mmol/L   Glucose, Bld 119 (H) 65 - 99 mg/dL   BUN 8 6 - 20 mg/dL   Creatinine, Ser 0.70 0.44 - 1.00 mg/dL   Calcium 8.8 (L) 8.9 - 10.3 mg/dL   Total Protein 5.6 (L) 6.5 - 8.1 g/dL   Albumin 2.8 (L) 3.5 - 5.0 g/dL   AST 12 (L) 15 - 41 U/L   ALT 9 (L) 14 - 54 U/L   Alkaline Phosphatase 39 38 - 126 U/L   Total Bilirubin 0.3 0.3 - 1.2 mg/dL   GFR calc non Af Amer >60 >60 mL/min   GFR calc Af Amer >60 >60 mL/min    Comment: (NOTE) The eGFR has been calculated using the CKD EPI equation. This calculation has not been validated in all clinical situations. eGFR's  persistently <60 mL/min signify possible Chronic Kidney Disease.    Anion gap 7 5 - 15  CBC     Status: Abnormal   Collection Time: 08/17/16  7:59 AM  Result Value Ref Range   WBC 15.9 (H) 4.0 - 10.5 K/uL   RBC 3.11 (L) 3.87 - 5.11 MIL/uL   Hemoglobin 8.7 (L) 12.0 - 15.0 g/dL   HCT 26.0 (L) 36.0 - 46.0 %   MCV 83.6 78.0 - 100.0 fL   MCH 28.0 26.0 - 34.0 pg   MCHC 33.5 30.0 - 36.0 g/dL   RDW 15.0 11.5 - 15.5 %   Platelets 287 150 - 400 K/uL  ABO/Rh     Status: None   Collection Time: 08/17/16 11:20 PM  Result Value Ref Range   ABO/RH(D) A POS   Urinalysis, Routine w reflex microscopic     Status: Abnormal   Collection Time: 08/20/16  3:05 PM  Result Value Ref Range   Color, Urine RED (A) YELLOW    Comment: BIOCHEMICALS MAY BE AFFECTED BY COLOR   APPearance CLEAR CLEAR   Specific Gravity, Urine 1.001 (L) 1.005 - 1.030   pH 7.0 5.0 - 8.0   Glucose, UA NEGATIVE NEGATIVE mg/dL  Hgb urine dipstick LARGE (A) NEGATIVE   Bilirubin Urine NEGATIVE NEGATIVE   Ketones, ur NEGATIVE NEGATIVE mg/dL   Protein, ur 100 (A) NEGATIVE mg/dL   Nitrite NEGATIVE NEGATIVE   Leukocytes, UA NEGATIVE NEGATIVE  Urinalysis, Microscopic (reflex)     Status: Abnormal   Collection Time: 08/20/16  3:05 PM  Result Value Ref Range   RBC / HPF 6-30 0 - 5 RBC/hpf   WBC, UA 0-5 0 - 5 WBC/hpf   Bacteria, UA RARE (A) NONE SEEN   Squamous Epithelial / LPF 0-5 (A) NONE SEEN  CBC with Differential/Platelet     Status: Abnormal   Collection Time: 08/20/16  3:38 PM  Result Value Ref Range   WBC 11.9 (H) 4.0 - 10.5 K/uL   RBC 3.09 (L) 3.87 - 5.11 MIL/uL   Hemoglobin 8.6 (L) 12.0 - 15.0 g/dL   HCT 25.9 (L) 36.0 - 46.0 %   MCV 83.8 78.0 - 100.0 fL   MCH 27.8 26.0 - 34.0 pg   MCHC 33.2 30.0 - 36.0 g/dL   RDW 14.8 11.5 - 15.5 %   Platelets 356 150 - 400 K/uL   Neutrophils Relative % 67 %   Neutro Abs 8.0 (H) 1.7 - 7.7 K/uL   Lymphocytes Relative 27 %   Lymphs Abs 3.2 0.7 - 4.0 K/uL   Monocytes Relative 4 %    Monocytes Absolute 0.5 0.1 - 1.0 K/uL   Eosinophils Relative 2 %   Eosinophils Absolute 0.2 0.0 - 0.7 K/uL   Basophils Relative 0 %   Basophils Absolute 0.0 0.0 - 0.1 K/uL  Comprehensive metabolic panel     Status: Abnormal   Collection Time: 08/20/16  3:38 PM  Result Value Ref Range   Sodium 138 135 - 145 mmol/L   Potassium 3.4 (L) 3.5 - 5.1 mmol/L   Chloride 103 101 - 111 mmol/L   CO2 24 22 - 32 mmol/L   Glucose, Bld 92 65 - 99 mg/dL   BUN 8 6 - 20 mg/dL   Creatinine, Ser 0.67 0.44 - 1.00 mg/dL   Calcium 9.1 8.9 - 10.3 mg/dL   Total Protein 6.3 (L) 6.5 - 8.1 g/dL   Albumin 3.3 (L) 3.5 - 5.0 g/dL   AST 25 15 - 41 U/L   ALT 15 14 - 54 U/L   Alkaline Phosphatase 44 38 - 126 U/L   Total Bilirubin 0.4 0.3 - 1.2 mg/dL   GFR calc non Af Amer >60 >60 mL/min   GFR calc Af Amer >60 >60 mL/min    Comment: (NOTE) The eGFR has been calculated using the CKD EPI equation. This calculation has not been validated in all clinical situations. eGFR's persistently <60 mL/min signify possible Chronic Kidney Disease.    Anion gap 11 5 - 15  Magnesium     Status: None   Collection Time: 08/20/16  9:11 PM  Result Value Ref Range   Magnesium 1.7 1.7 - 2.4 mg/dL  I-stat chem 8, ed     Status: Abnormal   Collection Time: 08/20/16  9:24 PM  Result Value Ref Range   Sodium 142 135 - 145 mmol/L   Potassium 3.2 (L) 3.5 - 5.1 mmol/L   Chloride 103 101 - 111 mmol/L   BUN 3 (L) 6 - 20 mg/dL   Creatinine, Ser 0.50 0.44 - 1.00 mg/dL   Glucose, Bld 107 (H) 65 - 99 mg/dL   Calcium, Ion 1.15 1.15 - 1.40 mmol/L   TCO2 25 0 -  100 mmol/L   Hemoglobin 8.8 (L) 12.0 - 15.0 g/dL   HCT 26.0 (L) 36.0 - 63.8 %  Basic metabolic panel     Status: None   Collection Time: 08/25/16  9:52 AM  Result Value Ref Range   Glucose 91 65 - 99 mg/dL   BUN 9 6 - 20 mg/dL   Creatinine, Ser 0.75 0.57 - 1.00 mg/dL   GFR calc non Af Amer 104 >59 mL/min/1.73   GFR calc Af Amer 120 >59 mL/min/1.73   BUN/Creatinine Ratio 12 9 -  23   Sodium 141 134 - 144 mmol/L   Potassium 4.8 3.5 - 5.2 mmol/L   Chloride 102 96 - 106 mmol/L   CO2 21 20 - 29 mmol/L   Calcium 10.0 8.7 - 10.2 mg/dL  CBC w/Diff     Status: Abnormal   Collection Time: 08/26/16 11:41 AM  Result Value Ref Range   WBC 12.0 (H) 4.0 - 10.5 K/uL   RBC 3.51 (L) 3.87 - 5.11 Mil/uL   Hemoglobin 9.7 (L) 12.0 - 15.0 g/dL   HCT 29.5 (L) 36.0 - 46.0 %   MCV 84.0 78.0 - 100.0 fl   MCHC 33.0 30.0 - 36.0 g/dL   RDW 14.7 11.5 - 15.5 %   Platelets 517.0 (H) 150.0 - 400.0 K/uL   Neutrophils Relative % 69.2 43.0 - 77.0 %   Lymphocytes Relative 23.0 12.0 - 46.0 %   Monocytes Relative 4.9 3.0 - 12.0 %   Eosinophils Relative 2.4 0.0 - 5.0 %   Basophils Relative 0.5 0.0 - 3.0 %   Neutro Abs 8.3 (H) 1.4 - 7.7 K/uL   Lymphs Abs 2.8 0.7 - 4.0 K/uL   Monocytes Absolute 0.6 0.1 - 1.0 K/uL   Eosinophils Absolute 0.3 0.0 - 0.7 K/uL   Basophils Absolute 0.1 0.0 - 0.1 K/uL    Assessment/Plan: 1. Episodic lightheadedness Improved but now with episodes of prolonged bradycardia. Asymptomatic at present. Will recheck CBC and CMP. Giving recent episode of recurrent vaginal bleeding, spoke with GYN who is going to be seeing patient early next week for assessment.  Spoke with Cardiology as well who has scheduled to see patient Monday for assessment. I spoke with patient concerning stopping amlodipine but she is declining adjustments at present. Strict ER precautions discussed with patient.  - CBC w/Diff - Comp Met (CMET)  2. Acute blood loss anemia Secondary to hemorrhage during D/E procedure. Hgb trending up at last check. Recent recurrence in bleeding, resolving 2 days ago. Repeat labs today. Spoke with GYN who is expediting follow-up appointment for her. Strict ER precautions reviewed with patient.  - CBC w/Diff - Comp Met (CMET)   Leeanne Rio, PA-C

## 2016-09-03 NOTE — Progress Notes (Signed)
Pre visit review using our clinic review tool, if applicable. No additional management support is needed unless otherwise documented below in the visit note. 

## 2016-09-06 ENCOUNTER — Encounter: Payer: Self-pay | Admitting: Cardiology

## 2016-09-06 ENCOUNTER — Telehealth: Payer: Self-pay | Admitting: Physician Assistant

## 2016-09-06 ENCOUNTER — Ambulatory Visit (INDEPENDENT_AMBULATORY_CARE_PROVIDER_SITE_OTHER): Payer: BLUE CROSS/BLUE SHIELD | Admitting: Cardiology

## 2016-09-06 VITALS — BP 134/88 | HR 94 | Ht 69.0 in | Wt 234.1 lb

## 2016-09-06 DIAGNOSIS — I493 Ventricular premature depolarization: Secondary | ICD-10-CM

## 2016-09-06 DIAGNOSIS — R002 Palpitations: Secondary | ICD-10-CM | POA: Diagnosis not present

## 2016-09-06 DIAGNOSIS — I10 Essential (primary) hypertension: Secondary | ICD-10-CM | POA: Diagnosis not present

## 2016-09-06 NOTE — Progress Notes (Signed)
Cardiology Office Note:    Date:  09/06/2016   ID:  Dominique Brucelanie R [place], DOB [birth year], MRN [id number]  PCP:  Waldon MerlMartin, William C, PA-C  Cardiologist:  Garwin Brothersajan R Revankar, MD   Referring MD: Waldon MerlMartin, William C, PA-C    ASSESSMENT:    1. Essential hypertension   2. PVC (premature ventricular contraction)   3. Palpitations    PLAN:    In order of problems listed above:  1. I reassured her about my findings. Overall the patient mentions to me that she feels much better now since she saw me a few days ago. I'm happy to note that. In view of her abnormal EKG we will do a Chem-7 and a magnesium level. In terms of her symptoms which I'm not sure palpitations however in view of multiple PVCs evident 1 week event monitor. Patient's echocardiogram is pending. She knows to go to the nearest emergency room for any significant symptoms. Her blood pressures better. I would refrain from using a beta blocker at this time in view of bradycardia that she has recorded in the past. This has not been symptomatic bradycardia. I believe her findings because she works in a hospital setting and she seems knowledgeable in taking her vital signs. She will be seen in follow-up appointment in a month or earlier if she has any concerns.   Medication Adjustments/Labs and Tests Ordered: Current medicines are reviewed at length with the patient today.  Concerns regarding medicines are outlined above.  Orders Placed This Encounter  Procedures  . Basic metabolic panel  . Magnesium  . Cardiac event monitor  . EKG 12-Lead   No orders of the defined types were placed in this encounter.    Chief Complaint  Patient presents with  . Follow-up    for lightheadedness and low heart rate   . Shortness of Breath     History of Present Illness:    Dominique Hughes is a 36 y.o. female . She was evaluated by me post termination of pregnancy. She has history of essential hypertension and palpitations. She mentions to me that she  occasionally has a strong heart beat sensation. No orthopnea or PND. She is not sure whether she has palpitations. At the time of my evaluation she is alert awake oriented and in no distress. She feels fatigued overall. She's had no syncope or any such problems. She mentions to me that she checked her heart rate about 5 or 6 days ago and her resting heart rate was about 40 or so. Subsequently this has resolved.  Past Medical History:  Diagnosis Date  . Anemia   . Attention deficit disorder (ADD)   . Fever of unknown origin (FUO)   . Headache    Migaines history   . Hypertension   . PONV (postoperative nausea and vomiting)     Past Surgical History:  Procedure Laterality Date  . CESAREAN SECTION    . DIAPHRAGM SURGERY    . NISSEN FUNDOPLICATION    . SHOULDER SURGERY Right   . SPINAL CORD STIMULATOR INSERTION    . SPINE SURGERY     L5/S1  . TONSILLECTOMY      Current Medications: Current Meds  Medication Sig  . amLODipine (NORVASC) 5 MG tablet Take 0.5 tablets (2.5 mg total) by mouth daily.  . clindamycin (CLEOCIN) 300 MG capsule Take 600 mg by mouth 4 (four) times daily.  Marland Kitchen. ibuprofen (ADVIL,MOTRIN) 600 MG tablet Take 1 tablet (600 mg total) by mouth every  6 (six) hours as needed (mild pain).  . IRON PO Take 1 tablet by mouth daily. otc medication, not sure of dose  . potassium chloride SA (K-DUR,KLOR-CON) 20 MEQ tablet Take 1 tablet (20 mEq total) by mouth 2 (two) times daily.  . Prenatal Vit-Fe Fumarate-FA (PRENATAL MULTIVITAMIN) TABS tablet Take 1 tablet by mouth daily at 12 noon.     Allergies:   Amoxicillin; Penicillins; and Apricot flavor   Social History   Social History  . Marital status: Single    Spouse name: N/A  . Number of children: N/A  . Years of education: N/A   Social History Main Topics  . Smoking status: Never Smoker  . Smokeless tobacco: Never Used  . Alcohol use No  . Drug use: No  . Sexual activity: Not Currently    Partners: Male   Other  Topics Concern  . None   Social History Narrative  . None     Family History: The patient's family history includes Cancer in her father, maternal grandmother, and paternal grandmother; Heart disease in her father and mother; Heart failure in her father and mother; Hypertension in her father and mother.  ROS:   Please see the history of present illness.    All other systems reviewed and are negative.  EKGs/Labs/Other Studies Reviewed:    The following studies were reviewed today: I reviewed EKG done today and this reveals frequent PVCs and prolonged QT. I discussed these findings with her at extensive length. I discussed these findings with her at extensive length.   Recent Labs: 08/20/2016: Magnesium 1.7 09/03/2016: ALT 11; BUN 8; Creatinine, Ser 0.61; Hemoglobin 10.1; Platelets 406.0; Potassium 4.5; Sodium 140  Recent Lipid Panel No results found for: CHOL, TRIG, HDL, CHOLHDL, VLDL, LDLCALC, LDLDIRECT  Physical Exam:    VS:  BP 134/88   Pulse 94   Ht 5\' 9"  (1.753 m)   Wt 234 lb 1.3 oz (106.2 kg)   SpO2 98%   BMI 34.57 kg/m     Wt Readings from Last 3 Encounters:  09/06/16 234 lb 1.3 oz (106.2 kg)  09/03/16 233 lb (105.7 kg)  08/26/16 228 lb (103.4 kg)     GEN: Patient is in no acute distress HEENT: Normal NECK: No JVD; No carotid bruits LYMPHATICS: No lymphadenopathy CARDIAC: Hear sounds regular, 2/6 systolic murmur at the apex. RESPIRATORY:  Clear to auscultation without rales, wheezing or rhonchi  ABDOMEN: Soft, non-tender, non-distended MUSCULOSKELETAL:  No edema; No deformity  SKIN: Warm and dry NEUROLOGIC:  Alert and oriented x 3 PSYCHIATRIC:  Normal affect   Signed, Garwin Brothersajan R Revankar, MD  09/06/2016 9:17 AM     [facility]

## 2016-09-06 NOTE — Telephone Encounter (Signed)
Called and spoke with patient to request her to have employer fax FMLA paperwork to pcp for completion/editing.  Patient states she will have them send form to pcp as requested.

## 2016-09-06 NOTE — Telephone Encounter (Signed)
Pt called back to state that the person with that handles FMLA will be out of the office until 8/6 and asking if Selena BattenCody could write a letter for her until she could have forms sent to us.

## 2016-09-06 NOTE — Telephone Encounter (Signed)
Patient states she is unable to respond back to [employer] message pcp sent her. However, she wants pcp to know she is suppoesed to work tonight but cardiologist informed her she should wear holter monitor for 2 weeks and should be out of work during this time.  She is also scheduled to have an echo later this week as well.  She states pcp was inquiring about her seeing an GYN for endometriosis.  She states she is schedule to see gyn specialist on this Wednesday.  She is requesting FMLA forms be updated stating she will be out of work and faxed to: [facility] Fax #  [phone] Attn : Ezzard StandingPam Kemp

## 2016-09-06 NOTE — Telephone Encounter (Signed)
Noted. Thank you. Please have patient call [employer] for them to send me FMLA paperwork for completion/editing.

## 2016-09-06 NOTE — Patient Instructions (Signed)
Medication Instructions:  Your physician recommends that you continue on your current medications as directed. Please refer to the Current Medication list given to you today.   Labwork: Your physician recommends that you return for lab work in: today. BMP, Magnesium   Testing/Procedures: Your physician has recommended that you wear an event monitor. Event monitors are medical devices that record the heart's electrical activity. Doctors most often us these monitors to diagnose arrhythmias. Arrhythmias are problems with the speed or rhythm of the heartbeat. The monitor is a small, portable device. You can wear one while you do your normal daily activities. This is usually used to diagnose what is causing palpitations/syncope (passing out).  You had an EKG today.  Follow-Up: Your physician recommends that you schedule a follow-up appointment in: 1 month.   Any Other Special Instructions Will Be Listed Below (If Applicable).     If you need a refill on your cardiac medications before your next appointment, please call your pharmacy.

## 2016-09-07 ENCOUNTER — Encounter: Payer: Self-pay | Admitting: Physician Assistant

## 2016-09-07 LAB — BASIC METABOLIC PANEL
BUN/Creatinine Ratio: 12 (ref 9–23)
BUN: 8 mg/dL (ref 6–20)
CALCIUM: 9.6 mg/dL (ref 8.7–10.2)
CO2: 20 mmol/L (ref 20–29)
CREATININE: 0.66 mg/dL (ref 0.57–1.00)
Chloride: 102 mmol/L (ref 96–106)
GFR, EST AFRICAN AMERICAN: 133 mL/min/{1.73_m2} (ref >59)
GFR, EST NON AFRICAN AMERICAN: 116 mL/min/{1.73_m2} (ref >59)
Glucose: 116 mg/dL — ABNORMAL HIGH (ref 65–99)
POTASSIUM: 4.6 mmol/L (ref 3.5–5.2)
Sodium: 138 mmol/L (ref 134–144)

## 2016-09-07 LAB — TSH: TSH: 1.85 u[IU]/mL (ref 0.450–4.500)

## 2016-09-07 LAB — MAGNESIUM: Magnesium: 2.1 mg/dL (ref 1.6–2.3)

## 2016-09-07 NOTE — Telephone Encounter (Signed)
Letter has been written. Will fax to the contact given -- Pam

## 2016-09-08 ENCOUNTER — Ambulatory Visit (HOSPITAL_BASED_OUTPATIENT_CLINIC_OR_DEPARTMENT_OTHER)
Admission: RE | Admit: 2016-09-08 | Discharge: 2016-09-08 | Disposition: A | Discharge status: Home / Self Care | Payer: BLUE CROSS/BLUE SHIELD | Source: Ambulatory Visit | Attending: Cardiology | Admitting: Cardiology

## 2016-09-08 DIAGNOSIS — E876 Hypokalemia: Secondary | ICD-10-CM

## 2016-09-08 DIAGNOSIS — I10 Essential (primary) hypertension: Secondary | ICD-10-CM

## 2016-09-08 DIAGNOSIS — I081 Rheumatic disorders of both mitral and tricuspid valves: Secondary | ICD-10-CM | POA: Diagnosis not present

## 2016-09-08 NOTE — Progress Notes (Signed)
  Echocardiogram 2D Echocardiogram has been performed.  Janalyn HarderWest, Rayley Gao R 09/08/2016, 2:07 PM

## 2016-09-14 ENCOUNTER — Ambulatory Visit (INDEPENDENT_AMBULATORY_CARE_PROVIDER_SITE_OTHER): Payer: BLUE CROSS/BLUE SHIELD

## 2016-09-14 ENCOUNTER — Other Ambulatory Visit: Payer: Self-pay | Admitting: Cardiology

## 2016-09-14 DIAGNOSIS — I493 Ventricular premature depolarization: Secondary | ICD-10-CM

## 2016-09-14 DIAGNOSIS — I10 Essential (primary) hypertension: Secondary | ICD-10-CM

## 2016-09-14 DIAGNOSIS — R002 Palpitations: Secondary | ICD-10-CM

## 2016-09-20 ENCOUNTER — Encounter: Payer: Self-pay | Admitting: Physician Assistant

## 2016-09-20 ENCOUNTER — Ambulatory Visit (INDEPENDENT_AMBULATORY_CARE_PROVIDER_SITE_OTHER): Payer: BLUE CROSS/BLUE SHIELD | Admitting: Physician Assistant

## 2016-09-20 VITALS — BP 120/90 | HR 79 | Temp 98.9°F | Resp 14 | Ht 69.0 in | Wt 235.0 lb

## 2016-09-20 DIAGNOSIS — R42 Dizziness and giddiness: Secondary | ICD-10-CM

## 2016-09-20 DIAGNOSIS — D509 Iron deficiency anemia, unspecified: Secondary | ICD-10-CM

## 2016-09-20 LAB — CBC WITH DIFFERENTIAL/PLATELET
BASOS ABS: 0.1 10*3/uL (ref 0.0–0.1)
BASOS PCT: 0.5 % (ref 0.0–3.0)
EOS PCT: 2.3 % (ref 0.0–5.0)
Eosinophils Absolute: 0.2 10*3/uL (ref 0.0–0.7)
HEMATOCRIT: 37.1 % (ref 36.0–46.0)
Hemoglobin: 11.8 g/dL — ABNORMAL LOW (ref 12.0–15.0)
LYMPHS ABS: 1.8 10*3/uL (ref 0.7–4.0)
LYMPHS PCT: 18.2 % (ref 12.0–46.0)
MCHC: 31.7 g/dL (ref 30.0–36.0)
MCV: 84.2 fl (ref 78.0–100.0)
MONOS PCT: 4.8 % (ref 3.0–12.0)
Monocytes Absolute: 0.5 10*3/uL (ref 0.1–1.0)
NEUTROS ABS: 7.3 10*3/uL (ref 1.4–7.7)
NEUTROS PCT: 74.2 % (ref 43.0–77.0)
PLATELETS: 435 10*3/uL — AB (ref 150.0–400.0)
RBC: 4.4 Mil/uL (ref 3.87–5.11)
RDW: 14.9 % (ref 11.5–15.5)
WBC: 9.8 10*3/uL (ref 4.0–10.5)

## 2016-09-20 LAB — COMPREHENSIVE METABOLIC PANEL
ALT: 15 U/L (ref 0–35)
AST: 13 U/L (ref 0–37)
Albumin: 4.4 g/dL (ref 3.5–5.2)
Alkaline Phosphatase: 62 U/L (ref 39–117)
BILIRUBIN TOTAL: 0.3 mg/dL (ref 0.2–1.2)
BUN: 8 mg/dL (ref 6–23)
CO2: 24 meq/L (ref 19–32)
CREATININE: 0.56 mg/dL (ref 0.40–1.20)
Calcium: 9.6 mg/dL (ref 8.4–10.5)
Chloride: 103 mEq/L (ref 96–112)
GFR: 131.39 mL/min (ref >60.00)
GLUCOSE: 90 mg/dL (ref 70–99)
Potassium: 4.4 mEq/L (ref 3.5–5.1)
Sodium: 137 mEq/L (ref 135–145)
Total Protein: 6.5 g/dL (ref 6.0–8.3)

## 2016-09-20 LAB — FERRITIN: Ferritin: 13.9 ng/mL (ref 10.0–291.0)

## 2016-09-20 NOTE — Progress Notes (Signed)
Patient presents to clinic today for follow-up. At last visit, patient was scheduled follow-up with Cardiology for Holter Monitor placement due to episodes of lightheadedness with low heart rate. Patient is currently wearing monitor and will continue to wear for a total of 2 weeks. Endorses feeling better overall over the past week but started feeling more fatigued today after starting her menstrual period yesterday.  Notes period is very heavy compared to prior periods. Is questioning if this is related to her recent hemorrhage during D/E procedure with GYN. Denies clotting. Is soaking 1 pad every few hours. Again notes increased fatigue but denies any lightheadedness, dizziness or SOB at present. Is taking her iron supplement as directed.   Past Medical History:  Diagnosis Date  . Anemia   . Attention deficit disorder (ADD)   . Fever of unknown origin (FUO)   . Headache    Migaines history   . Hypertension   . PONV (postoperative nausea and vomiting)     Current Outpatient Prescriptions on File Prior to Visit  Medication Sig Dispense Refill  . amLODipine (NORVASC) 5 MG tablet Take 0.5 tablets (2.5 mg total) by mouth daily. 30 tablet 6  . ibuprofen (ADVIL,MOTRIN) 600 MG tablet Take 1 tablet (600 mg total) by mouth every 6 (six) hours as needed (mild pain). 30 tablet 0  . IRON PO Take 1 tablet by mouth daily. otc medication, not sure of dose    . potassium chloride SA (K-DUR,KLOR-CON) 20 MEQ tablet Take 1 tablet (20 mEq total) by mouth 2 (two) times daily. 60 tablet 1  . Prenatal Vit-Fe Fumarate-FA (PRENATAL MULTIVITAMIN) TABS tablet Take 1 tablet by mouth daily at 12 noon.     No current facility-administered medications on file prior to visit.     Allergies  Allergen Reactions  . Amoxicillin Anaphylaxis  . Penicillins Anaphylaxis    Has patient had a PCN reaction causing immediate rash, facial/tongue/throat swelling, SOB or lightheadedness with hypotension: Yes Has patient had a  PCN reaction causing severe rash involving mucus membranes or skin necrosis: No Has patient had a PCN reaction that required hospitalization: Yes Has patient had a PCN reaction occurring within the last 10 years: No If all of the above answers are "NO", then may proceed with Cephalosporin use.   Marland Kitchen Apricot Flavor Hives and Swelling    Facial swelling    Family History  Problem Relation Age of Onset  . Hypertension Mother   . Heart disease Mother        Mitral Valve  . Heart failure Mother   . Cancer Father        skin  . Hypertension Father   . Heart disease Father        Cardiac Stent  . Heart failure Father   . Cancer Maternal Grandmother        breast  . Cancer Paternal Grandmother        lung    Social History   Social History  . Marital status: Single    Spouse name: N/A  . Number of children: N/A  . Years of education: N/A   Social History Main Topics  . Smoking status: Never Smoker  . Smokeless tobacco: Never Used  . Alcohol use No  . Drug use: No  . Sexual activity: Not Currently    Partners: Male   Other Topics Concern  . None   Social History Narrative  . None   Review of Systems - See HPI.  All other ROS are negative.  BP 120/90   Pulse 79   Temp 98.9 F (37.2 C) (Oral)   Resp 14   Ht 5' 9"  (1.753 m)   Wt 235 lb (106.6 kg)   SpO2 98%   BMI 34.70 kg/m   Physical Exam  Constitutional: She is oriented to person, place, and time and well-developed, well-nourished, and in no distress.  HENT:  Head: Normocephalic and atraumatic.  Eyes: Conjunctivae are normal.  Neck: Neck supple.  Cardiovascular: Normal rate, regular rhythm, normal heart sounds and intact distal pulses.   Pulmonary/Chest: Effort normal and breath sounds normal. No respiratory distress. She has no wheezes. She has no rales. She exhibits no tenderness.  Neurological: She is alert and oriented to person, place, and time.  Skin: Skin is warm and dry. No rash noted.  Psychiatric:  Affect normal.  Vitals reviewed.  Recent Results (from the past 2160 hour(s))  Protein / creatinine ratio, urine     Status: None   Collection Time: 08/16/16 10:55 PM  Result Value Ref Range   Creatinine, Urine 195.00 mg/dL   Total Protein, Urine 21 mg/dL    Comment: NO NORMAL RANGE ESTABLISHED FOR THIS TEST   Protein Creatinine Ratio 0.11 0.00 - 0.15 mg/mg[Cre]  Type and screen [place]     Status: None   Collection Time: 08/16/16 11:20 PM  Result Value Ref Range   ABO/RH(D) A POS    Antibody Screen NEG    Sample Expiration 08/19/2016    Unit Number [id number]    Blood Component Type RED CELLS,LR    Unit division 00    Status of Unit REL FROM [facility]    Transfusion Status OK TO TRANSFUSE    Crossmatch Result Compatible    Unit Number [id number]    Blood Component Type RED CELLS,LR    Unit division 00    Status of Unit REL FROM [facility]    Transfusion Status OK TO TRANSFUSE    Crossmatch Result Compatible   CBC     Status: Abnormal   Collection Time: 08/16/16 11:20 PM  Result Value Ref Range   WBC 14.1 (H) 4.0 - 10.5 K/uL   RBC 4.29 3.87 - 5.11 MIL/uL   Hemoglobin 11.8 (L) 12.0 - 15.0 g/dL   HCT 35.8 (L) 36.0 - 46.0 %   MCV 83.4 78.0 - 100.0 fL   MCH 27.5 26.0 - 34.0 pg   MCHC 33.0 30.0 - 36.0 g/dL   RDW 14.9 11.5 - 15.5 %   Platelets 336 150 - 400 K/uL  BPAM RBC     Status: None   Collection Time: 08/16/16 11:20 PM  Result Value Ref Range   ISSUE DATE / TIME [id number]    Blood Product Unit Number [id number]    PRODUCT CODE E0336V00    Unit Type and Rh 4174    Blood Product Expiration Date 081448185631    ISSUE DATE / TIME 497026378588    Blood Product Unit Number [id number]    PRODUCT CODE E7209O70    Unit Type and Rh 6200    Blood Product Expiration Date [id number]   RPR     Status: None   Collection Time: 08/16/16 11:56 PM  Result Value Ref Range   RPR Ser Ql Non Reactive Non Reactive    Comment: (NOTE) Performed At: [facility] [address] [place], [place] [id number] Lindon Romp MD FK:[id number]   Prepare RBC     Status: None  Collection Time: 08/17/16 12:00 AM  Result Value Ref Range   Order Confirmation ORDER PROCESSED BY BLOOD BANK   CBC     Status: Abnormal   Collection Time: 08/17/16 12:55 AM  Result Value Ref Range   WBC 17.2 (H) 4.0 - 10.5 K/uL   RBC 3.41 (L) 3.87 - 5.11 MIL/uL   Hemoglobin 9.5 (L) 12.0 - 15.0 g/dL   HCT 29.1 (L) 36.0 - 46.0 %   MCV 85.3 78.0 - 100.0 fL   MCH 27.9 26.0 - 34.0 pg   MCHC 32.6 30.0 - 36.0 g/dL   RDW 14.9 11.5 - 15.5 %   Platelets 282 150 - 400 K/uL  Comprehensive metabolic panel     Status: Abnormal   Collection Time: 08/17/16 12:55 AM  Result Value Ref Range   Sodium 138 135 - 145 mmol/L   Potassium 3.8 3.5 - 5.1 mmol/L   Chloride 106 101 - 111 mmol/L   CO2 25 22 - 32 mmol/L   Glucose, Bld 119 (H) 65 - 99 mg/dL   BUN 8 6 - 20 mg/dL   Creatinine, Ser 0.70 0.44 - 1.00 mg/dL   Calcium 8.8 (L) 8.9 - 10.3 mg/dL   Total Protein 5.6 (L) 6.5 - 8.1 g/dL   Albumin 2.8 (L) 3.5 - 5.0 g/dL   AST 12 (L) 15 - 41 U/L   ALT 9 (L) 14 - 54 U/L   Alkaline Phosphatase 39 38 - 126 U/L   Total Bilirubin 0.3 0.3 - 1.2 mg/dL   GFR calc non Af Amer >60 >60 mL/min   GFR calc Af Amer >60 >60 mL/min    Comment: (NOTE) The eGFR has been calculated using the CKD EPI equation. This calculation has not been validated in all clinical situations. eGFR's persistently <60 mL/min signify possible Chronic Kidney Disease.    Anion gap 7 5 - 15  CBC     Status: Abnormal   Collection Time: 08/17/16  7:59 AM  Result Value Ref Range   WBC 15.9 (H) 4.0 - 10.5 K/uL   RBC 3.11 (L) 3.87 - 5.11 MIL/uL   Hemoglobin 8.7 (L) 12.0 - 15.0 g/dL   HCT 26.0 (L) 36.0 - 46.0 %   MCV 83.6 78.0 - 100.0 fL   MCH 28.0 26.0 - 34.0 pg   MCHC 33.5 30.0 - 36.0 g/dL   RDW 15.0 11.5 - 15.5 %   Platelets 287 150 - 400 K/uL  ABO/Rh     Status: None   Collection Time: 08/17/16 11:20 PM    Result Value Ref Range   ABO/RH(D) A POS   Urinalysis, Routine w reflex microscopic     Status: Abnormal   Collection Time: 08/20/16  3:05 PM  Result Value Ref Range   Color, Urine RED (A) YELLOW    Comment: BIOCHEMICALS MAY BE AFFECTED BY COLOR   APPearance CLEAR CLEAR   Specific Gravity, Urine 1.001 (L) 1.005 - 1.030   pH 7.0 5.0 - 8.0   Glucose, UA NEGATIVE NEGATIVE mg/dL   Hgb urine dipstick LARGE (A) NEGATIVE   Bilirubin Urine NEGATIVE NEGATIVE   Ketones, ur NEGATIVE NEGATIVE mg/dL   Protein, ur 100 (A) NEGATIVE mg/dL   Nitrite NEGATIVE NEGATIVE   Leukocytes, UA NEGATIVE NEGATIVE  Urinalysis, Microscopic (reflex)     Status: Abnormal   Collection Time: 08/20/16  3:05 PM  Result Value Ref Range   RBC / HPF 6-30 0 - 5 RBC/hpf   WBC, UA 0-5 0 - 5  WBC/hpf   Bacteria, UA RARE (A) NONE SEEN   Squamous Epithelial / LPF 0-5 (A) NONE SEEN  CBC with Differential/Platelet     Status: Abnormal   Collection Time: 08/20/16  3:38 PM  Result Value Ref Range   WBC 11.9 (H) 4.0 - 10.5 K/uL   RBC 3.09 (L) 3.87 - 5.11 MIL/uL   Hemoglobin 8.6 (L) 12.0 - 15.0 g/dL   HCT 25.9 (L) 36.0 - 46.0 %   MCV 83.8 78.0 - 100.0 fL   MCH 27.8 26.0 - 34.0 pg   MCHC 33.2 30.0 - 36.0 g/dL   RDW 14.8 11.5 - 15.5 %   Platelets 356 150 - 400 K/uL   Neutrophils Relative % 67 %   Neutro Abs 8.0 (H) 1.7 - 7.7 K/uL   Lymphocytes Relative 27 %   Lymphs Abs 3.2 0.7 - 4.0 K/uL   Monocytes Relative 4 %   Monocytes Absolute 0.5 0.1 - 1.0 K/uL   Eosinophils Relative 2 %   Eosinophils Absolute 0.2 0.0 - 0.7 K/uL   Basophils Relative 0 %   Basophils Absolute 0.0 0.0 - 0.1 K/uL  Comprehensive metabolic panel     Status: Abnormal   Collection Time: 08/20/16  3:38 PM  Result Value Ref Range   Sodium 138 135 - 145 mmol/L   Potassium 3.4 (L) 3.5 - 5.1 mmol/L   Chloride 103 101 - 111 mmol/L   CO2 24 22 - 32 mmol/L   Glucose, Bld 92 65 - 99 mg/dL   BUN 8 6 - 20 mg/dL   Creatinine, Ser 0.67 0.44 - 1.00 mg/dL    Calcium 9.1 8.9 - 10.3 mg/dL   Total Protein 6.3 (L) 6.5 - 8.1 g/dL   Albumin 3.3 (L) 3.5 - 5.0 g/dL   AST 25 15 - 41 U/L   ALT 15 14 - 54 U/L   Alkaline Phosphatase 44 38 - 126 U/L   Total Bilirubin 0.4 0.3 - 1.2 mg/dL   GFR calc non Af Amer >60 >60 mL/min   GFR calc Af Amer >60 >60 mL/min    Comment: (NOTE) The eGFR has been calculated using the CKD EPI equation. This calculation has not been validated in all clinical situations. eGFR's persistently <60 mL/min signify possible Chronic Kidney Disease.    Anion gap 11 5 - 15  Magnesium     Status: None   Collection Time: 08/20/16  9:11 PM  Result Value Ref Range   Magnesium 1.7 1.7 - 2.4 mg/dL  I-stat chem 8, ed     Status: Abnormal   Collection Time: 08/20/16  9:24 PM  Result Value Ref Range   Sodium 142 135 - 145 mmol/L   Potassium 3.2 (L) 3.5 - 5.1 mmol/L   Chloride 103 101 - 111 mmol/L   BUN 3 (L) 6 - 20 mg/dL   Creatinine, Ser 0.50 0.44 - 1.00 mg/dL   Glucose, Bld 107 (H) 65 - 99 mg/dL   Calcium, Ion 1.15 1.15 - 1.40 mmol/L   TCO2 25 0 - 100 mmol/L   Hemoglobin 8.8 (L) 12.0 - 15.0 g/dL   HCT 26.0 (L) 36.0 - 97.7 %  Basic metabolic panel     Status: None   Collection Time: 08/25/16  9:52 AM  Result Value Ref Range   Glucose 91 65 - 99 mg/dL   BUN 9 6 - 20 mg/dL   Creatinine, Ser 0.75 0.57 - 1.00 mg/dL   GFR calc non Af Amer 104 >59 mL/min/1.73  GFR calc Af Amer 120 >59 mL/min/1.73   BUN/Creatinine Ratio 12 9 - 23   Sodium 141 134 - 144 mmol/L   Potassium 4.8 3.5 - 5.2 mmol/L   Chloride 102 96 - 106 mmol/L   CO2 21 20 - 29 mmol/L   Calcium 10.0 8.7 - 10.2 mg/dL  CBC w/Diff     Status: Abnormal   Collection Time: 08/26/16 11:41 AM  Result Value Ref Range   WBC 12.0 (H) 4.0 - 10.5 K/uL   RBC 3.51 (L) 3.87 - 5.11 Mil/uL   Hemoglobin 9.7 (L) 12.0 - 15.0 g/dL   HCT 29.5 (L) 36.0 - 46.0 %   MCV 84.0 78.0 - 100.0 fl   MCHC 33.0 30.0 - 36.0 g/dL   RDW 14.7 11.5 - 15.5 %   Platelets 517.0 (H) 150.0 - 400.0 K/uL    Neutrophils Relative % 69.2 43.0 - 77.0 %   Lymphocytes Relative 23.0 12.0 - 46.0 %   Monocytes Relative 4.9 3.0 - 12.0 %   Eosinophils Relative 2.4 0.0 - 5.0 %   Basophils Relative 0.5 0.0 - 3.0 %   Neutro Abs 8.3 (H) 1.4 - 7.7 K/uL   Lymphs Abs 2.8 0.7 - 4.0 K/uL   Monocytes Absolute 0.6 0.1 - 1.0 K/uL   Eosinophils Absolute 0.3 0.0 - 0.7 K/uL   Basophils Absolute 0.1 0.0 - 0.1 K/uL  CBC w/Diff     Status: Abnormal   Collection Time: 09/03/16 11:17 AM  Result Value Ref Range   WBC 7.9 4.0 - 10.5 K/uL   RBC 3.73 (L) 3.87 - 5.11 Mil/uL   Hemoglobin 10.1 (L) 12.0 - 15.0 g/dL   HCT 31.2 (L) 36.0 - 46.0 %   MCV 83.6 78.0 - 100.0 fl   MCHC 32.3 30.0 - 36.0 g/dL   RDW 14.5 11.5 - 15.5 %   Platelets 406.0 (H) 150.0 - 400.0 K/uL   Neutrophils Relative % 68.5 43.0 - 77.0 %   Lymphocytes Relative 21.9 12.0 - 46.0 %   Monocytes Relative 5.7 3.0 - 12.0 %   Eosinophils Relative 3.2 0.0 - 5.0 %   Basophils Relative 0.7 0.0 - 3.0 %   Neutro Abs 5.4 1.4 - 7.7 K/uL   Lymphs Abs 1.7 0.7 - 4.0 K/uL   Monocytes Absolute 0.4 0.1 - 1.0 K/uL   Eosinophils Absolute 0.3 0.0 - 0.7 K/uL   Basophils Absolute 0.1 0.0 - 0.1 K/uL  Comp Met (CMET)     Status: None   Collection Time: 09/03/16 11:17 AM  Result Value Ref Range   Sodium 140 135 - 145 mEq/L   Potassium 4.5 3.5 - 5.1 mEq/L   Chloride 106 96 - 112 mEq/L   CO2 25 19 - 32 mEq/L   Glucose, Bld 98 70 - 99 mg/dL   BUN 8 6 - 23 mg/dL   Creatinine, Ser 0.61 0.40 - 1.20 mg/dL   Total Bilirubin 0.3 0.2 - 1.2 mg/dL   Alkaline Phosphatase 54 39 - 117 U/L   AST 10 0 - 37 U/L   ALT 11 0 - 35 U/L   Total Protein 6.5 6.0 - 8.3 g/dL   Albumin 4.0 3.5 - 5.2 g/dL   Calcium 9.7 8.4 - 10.5 mg/dL   GFR 119.07 >60.00 mL/min  Basic metabolic panel     Status: Abnormal   Collection Time: 09/06/16  9:23 AM  Result Value Ref Range   Glucose 116 (H) 65 - 99 mg/dL   BUN 8 6 -  20 mg/dL   Creatinine, Ser 0.66 0.57 - 1.00 mg/dL   GFR calc non Af Amer 116 >59  mL/min/1.73   GFR calc Af Amer 133 >59 mL/min/1.73   BUN/Creatinine Ratio 12 9 - 23   Sodium 138 134 - 144 mmol/L   Potassium 4.6 3.5 - 5.2 mmol/L   Chloride 102 96 - 106 mmol/L   CO2 20 20 - 29 mmol/L   Calcium 9.6 8.7 - 10.2 mg/dL  Magnesium     Status: None   Collection Time: 09/06/16  9:23 AM  Result Value Ref Range   Magnesium 2.1 1.6 - 2.3 mg/dL  TSH     Status: None   Collection Time: 09/06/16  9:27 AM  Result Value Ref Range   TSH 1.850 0.450 - 4.500 uIU/mL   Assessment/Plan: 1. Iron deficiency anemia, unspecified iron deficiency anemia type Taking supplement as directed. Has started heavy cycle. Patient has call in to GYN to discuss. Will repeat labs today to assess that anemia is still improving or has stabilized. Will increase iron to TID during cycle. She is to let us know if she does not hear back from her GYN today.  - CBC w/Diff - Comp Met (CMET) - Ferritin  2. Episodic lightheadedness Holter in place. Patient denies current symptoms. BP looks good today. This is multifactorial giving her anemia but further cardiac assessment if definitely needed due to her episodes of bradycardia. Has follow-up scheduled.   Again ER precautions reviewed with patient.    Leeanne Rio, PA-C

## 2016-09-20 NOTE — Progress Notes (Signed)
Pre visit review using our clinic review tool, if applicable. No additional management support is needed unless otherwise documented below in the visit note. 

## 2016-09-20 NOTE — Patient Instructions (Signed)
Please go to the lab for blood work. I will call with your results.  I am taking you out of work for an additional week until we get a further workup complete.  Please contact your Gynecologist to discuss current menstrual period.  If this is not calming down or you note any shortness of breath or significant lightheadedness, please have someone carry you to the ER.

## 2016-09-27 ENCOUNTER — Ambulatory Visit: Payer: BLUE CROSS/BLUE SHIELD | Admitting: Cardiology

## 2016-09-27 ENCOUNTER — Encounter: Payer: Self-pay | Admitting: Physician Assistant

## 2016-09-27 ENCOUNTER — Ambulatory Visit (INDEPENDENT_AMBULATORY_CARE_PROVIDER_SITE_OTHER): Payer: BLUE CROSS/BLUE SHIELD | Admitting: Physician Assistant

## 2016-09-27 VITALS — BP 120/82 | HR 82 | Temp 98.8°F | Resp 14 | Ht 69.0 in | Wt 237.0 lb

## 2016-09-27 DIAGNOSIS — R002 Palpitations: Secondary | ICD-10-CM | POA: Diagnosis not present

## 2016-09-27 DIAGNOSIS — D509 Iron deficiency anemia, unspecified: Secondary | ICD-10-CM | POA: Diagnosis not present

## 2016-09-27 LAB — CBC WITH DIFFERENTIAL/PLATELET
BASOS ABS: 0.1 10*3/uL (ref 0.0–0.1)
Basophils Relative: 0.8 % (ref 0.0–3.0)
Eosinophils Absolute: 0.2 10*3/uL (ref 0.0–0.7)
Eosinophils Relative: 2.8 % (ref 0.0–5.0)
HCT: 37.2 % (ref 36.0–46.0)
Hemoglobin: 12 g/dL (ref 12.0–15.0)
LYMPHS ABS: 2 10*3/uL (ref 0.7–4.0)
Lymphocytes Relative: 23.4 % (ref 12.0–46.0)
MCHC: 32.1 g/dL (ref 30.0–36.0)
MCV: 83.4 fl (ref 78.0–100.0)
MONO ABS: 0.5 10*3/uL (ref 0.1–1.0)
MONOS PCT: 6.1 % (ref 3.0–12.0)
NEUTROS PCT: 66.9 % (ref 43.0–77.0)
Neutro Abs: 5.7 10*3/uL (ref 1.4–7.7)
Platelets: 426 10*3/uL — ABNORMAL HIGH (ref 150.0–400.0)
RBC: 4.46 Mil/uL (ref 3.87–5.11)
RDW: 14.8 % (ref 11.5–15.5)
WBC: 8.5 10*3/uL (ref 4.0–10.5)

## 2016-09-27 NOTE — Progress Notes (Signed)
Pre visit review using our clinic review tool, if applicable. No additional management support is needed unless otherwise documented below in the visit note. 

## 2016-09-27 NOTE — Patient Instructions (Signed)
Please go to the lab for blood work. Stay hydrated and rest. Continue iron three times daily until menstrual period resolves. Then can decrease to twice daily dosing.   Please keep wearing Holter monitor as directed by Cardiology. Follow-up with them as scheduled next week.  Please continue amlodipine as directed.  If there is any worsening symptoms or alarm symptoms -- chest pain or shortness of breath -- call 911 or have someone carry you to the ER.  Follow-up with me in 2 weeks.

## 2016-09-27 NOTE — Progress Notes (Signed)
Patient presents to clinic today for follow-up of iron deficiency anemia with episodic lightheadedness. At last visit, patient has just started her first menstrual period after having her D/E. Notes period is much heavier than usual. Denies increased pain compared to usual menstrual period. Did not some dizziness and racing heart over the weekend. States these were mild. Is turning in her Holter Monitor this week. Denies chest pain or shortness of breath.  On Sunday patient noted a presyncopal episode on standing.  BP -- staying in 128/80s at home. Still having episodes where heart rate is dropping into 40-50s. Has follow-up scheduled with Cardiology. Patient notes she is sleeping well, staying hydrated and eating a well-balanced diet.  .   Past Medical History:  Diagnosis Date  . Anemia   . Attention deficit disorder (ADD)   . Fever of unknown origin (FUO)   . Headache    Migaines history   . Hypertension   . PONV (postoperative nausea and vomiting)     Current Outpatient Prescriptions on File Prior to Visit  Medication Sig Dispense Refill  . amLODipine (NORVASC) 5 MG tablet Take 0.5 tablets (2.5 mg total) by mouth daily. 30 tablet 6  . IRON PO Take 1 tablet by mouth 3 (three) times daily. otc medication, not sure of dose     . potassium chloride SA (K-DUR,KLOR-CON) 20 MEQ tablet Take 1 tablet (20 mEq total) by mouth 2 (two) times daily. 60 tablet 1  . Prenatal Vit-Fe Fumarate-FA (PRENATAL MULTIVITAMIN) TABS tablet Take 1 tablet by mouth daily at 12 noon.     No current facility-administered medications on file prior to visit.     Allergies  Allergen Reactions  . Amoxicillin Anaphylaxis  . Penicillins Anaphylaxis    Has patient had a PCN reaction causing immediate rash, facial/tongue/throat swelling, SOB or lightheadedness with hypotension: Yes Has patient had a PCN reaction causing severe rash involving mucus membranes or skin necrosis: No Has patient had a PCN reaction that  required hospitalization: Yes Has patient had a PCN reaction occurring within the last 10 years: No If all of the above answers are "NO", then may proceed with Cephalosporin use.   Marland Kitchen Apricot Flavor Hives and Swelling    Facial swelling    Family History  Problem Relation Age of Onset  . Hypertension Mother   . Heart disease Mother        Mitral Valve  . Heart failure Mother   . Cancer Father        skin  . Hypertension Father   . Heart disease Father        Cardiac Stent  . Heart failure Father   . Cancer Maternal Grandmother        breast  . Cancer Paternal Grandmother        lung    Social History   Social History  . Marital status: Single    Spouse name: N/A  . Number of children: N/A  . Years of education: N/A   Social History Main Topics  . Smoking status: Never Smoker  . Smokeless tobacco: Never Used  . Alcohol use No  . Drug use: No  . Sexual activity: Not Currently    Partners: Male   Other Topics Concern  . None   Social History Narrative  . None   Review of Systems - See HPI.  All other ROS are negative.  BP 120/82   Pulse 82   Temp 99.4 F (37.4 C) (Oral)  Resp 14   Ht 5' 9" (1.753 m)   Wt 237 lb (107.5 kg)   SpO2 98%   BMI 35.00 kg/m   Physical Exam  Constitutional: She is oriented to person, place, and time and well-developed, well-nourished, and in no distress.  HENT:  Head: Normocephalic and atraumatic.  Eyes: Conjunctivae are normal.  Cardiovascular: Normal rate, regular rhythm, normal heart sounds and intact distal pulses.   Pulmonary/Chest: Effort normal and breath sounds normal. No respiratory distress. She has no wheezes. She has no rales. She exhibits no tenderness.  Abdominal: Soft. Bowel sounds are normal. She exhibits no distension. There is no tenderness.  Neurological: She is alert and oriented to person, place, and time.  Skin: Skin is warm and dry. No rash noted.  Psychiatric: Affect normal.  Vitals  reviewed.   Recent Results (from the past 2160 hour(s))  Protein / creatinine ratio, urine     Status: None   Collection Time: 08/16/16 10:55 PM  Result Value Ref Range   Creatinine, Urine 195.00 mg/dL   Total Protein, Urine 21 mg/dL    Comment: NO NORMAL RANGE ESTABLISHED FOR THIS TEST   Protein Creatinine Ratio 0.11 0.00 - 0.15 mg/mg[Cre]  Type and screen [facility]     Status: None   Collection Time: 08/16/16 11:20 PM  Result Value Ref Range   ABO/RH(D) A POS    Antibody Screen NEG    Sample Expiration 08/19/2016    Unit Number [id number]    Blood Component Type RED CELLS,LR    Unit division 00    Status of Unit REL FROM [facility]    Transfusion Status OK TO TRANSFUSE    Crossmatch Result Compatible    Unit Number [id number]    Blood Component Type RED CELLS,LR    Unit division 00    Status of Unit REL FROM [facility]    Transfusion Status OK TO TRANSFUSE    Crossmatch Result Compatible   CBC     Status: Abnormal   Collection Time: 08/16/16 11:20 PM  Result Value Ref Range   WBC 14.1 (H) 4.0 - 10.5 K/uL   RBC 4.29 3.87 - 5.11 MIL/uL   Hemoglobin 11.8 (L) 12.0 - 15.0 g/dL   HCT 35.8 (L) 36.0 - 46.0 %   MCV 83.4 78.0 - 100.0 fL   MCH 27.5 26.0 - 34.0 pg   MCHC 33.0 30.0 - 36.0 g/dL   RDW 14.9 11.5 - 15.5 %   Platelets 336 150 - 400 K/uL  BPAM RBC     Status: None   Collection Time: 08/16/16 11:20 PM  Result Value Ref Range   ISSUE DATE / TIME [id number]    Blood Product Unit Number [id number]    PRODUCT CODE E0336V00    Unit Type and Rh 5277    Blood Product Expiration Date [id number]    ISSUE DATE / TIME 154008676195    Blood Product Unit Number [id number]    PRODUCT CODE X8338S50    Unit Type and Rh 6200    Blood Product Expiration Date [id number]   RPR     Status: None   Collection Time: 08/16/16 11:56 PM  Result Value Ref Range   RPR Ser Ql Non Reactive Non Reactive    Comment: (NOTE) Performed At: [facility] [place], [place] [id number] Lindon Romp MD [id number]   Prepare RBC     Status: None   Collection Time: 08/17/16 12:00 AM  Result Value  Ref Range   Order Confirmation ORDER PROCESSED BY BLOOD BANK   CBC     Status: Abnormal   Collection Time: 08/17/16 12:55 AM  Result Value Ref Range   WBC 17.2 (H) 4.0 - 10.5 K/uL   RBC 3.41 (L) 3.87 - 5.11 MIL/uL   Hemoglobin 9.5 (L) 12.0 - 15.0 g/dL   HCT 29.1 (L) 36.0 - 46.0 %   MCV 85.3 78.0 - 100.0 fL   MCH 27.9 26.0 - 34.0 pg   MCHC 32.6 30.0 - 36.0 g/dL   RDW 14.9 11.5 - 15.5 %   Platelets 282 150 - 400 K/uL  Comprehensive metabolic panel     Status: Abnormal   Collection Time: 08/17/16 12:55 AM  Result Value Ref Range   Sodium 138 135 - 145 mmol/L   Potassium 3.8 3.5 - 5.1 mmol/L   Chloride 106 101 - 111 mmol/L   CO2 25 22 - 32 mmol/L   Glucose, Bld 119 (H) 65 - 99 mg/dL   BUN 8 6 - 20 mg/dL   Creatinine, Ser 0.70 0.44 - 1.00 mg/dL   Calcium 8.8 (L) 8.9 - 10.3 mg/dL   Total Protein 5.6 (L) 6.5 - 8.1 g/dL   Albumin 2.8 (L) 3.5 - 5.0 g/dL   AST 12 (L) 15 - 41 U/L   ALT 9 (L) 14 - 54 U/L   Alkaline Phosphatase 39 38 - 126 U/L   Total Bilirubin 0.3 0.3 - 1.2 mg/dL   GFR calc non Af Amer >60 >60 mL/min   GFR calc Af Amer >60 >60 mL/min    Comment: (NOTE) The eGFR has been calculated using the CKD EPI equation. This calculation has not been validated in all clinical situations. eGFR's persistently <60 mL/min signify possible Chronic Kidney Disease.    Anion gap 7 5 - 15  CBC     Status: Abnormal   Collection Time: 08/17/16  7:59 AM  Result Value Ref Range   WBC 15.9 (H) 4.0 - 10.5 K/uL   RBC 3.11 (L) 3.87 - 5.11 MIL/uL   Hemoglobin 8.7 (L) 12.0 - 15.0 g/dL   HCT 26.0 (L) 36.0 - 46.0 %   MCV 83.6 78.0 - 100.0 fL   MCH 28.0 26.0 - 34.0 pg   MCHC 33.5 30.0 - 36.0 g/dL   RDW 15.0 11.5 - 15.5 %   Platelets 287 150 - 400 K/uL  ABO/Rh     Status: None   Collection Time: 08/17/16 11:20 PM  Result  Value Ref Range   ABO/RH(D) A POS   Urinalysis, Routine w reflex microscopic     Status: Abnormal   Collection Time: 08/20/16  3:05 PM  Result Value Ref Range   Color, Urine RED (A) YELLOW    Comment: BIOCHEMICALS MAY BE AFFECTED BY COLOR   APPearance CLEAR CLEAR   Specific Gravity, Urine 1.001 (L) 1.005 - 1.030   pH 7.0 5.0 - 8.0   Glucose, UA NEGATIVE NEGATIVE mg/dL   Hgb urine dipstick LARGE (A) NEGATIVE   Bilirubin Urine NEGATIVE NEGATIVE   Ketones, ur NEGATIVE NEGATIVE mg/dL   Protein, ur 100 (A) NEGATIVE mg/dL   Nitrite NEGATIVE NEGATIVE   Leukocytes, UA NEGATIVE NEGATIVE  Urinalysis, Microscopic (reflex)     Status: Abnormal   Collection Time: 08/20/16  3:05 PM  Result Value Ref Range   RBC / HPF 6-30 0 - 5 RBC/hpf   WBC, UA 0-5 0 - 5 WBC/hpf   Bacteria, UA RARE (A) NONE SEEN  Squamous Epithelial / LPF 0-5 (A) NONE SEEN  CBC with Differential/Platelet     Status: Abnormal   Collection Time: 08/20/16  3:38 PM  Result Value Ref Range   WBC 11.9 (H) 4.0 - 10.5 K/uL   RBC 3.09 (L) 3.87 - 5.11 MIL/uL   Hemoglobin 8.6 (L) 12.0 - 15.0 g/dL   HCT 25.9 (L) 36.0 - 46.0 %   MCV 83.8 78.0 - 100.0 fL   MCH 27.8 26.0 - 34.0 pg   MCHC 33.2 30.0 - 36.0 g/dL   RDW 14.8 11.5 - 15.5 %   Platelets 356 150 - 400 K/uL   Neutrophils Relative % 67 %   Neutro Abs 8.0 (H) 1.7 - 7.7 K/uL   Lymphocytes Relative 27 %   Lymphs Abs 3.2 0.7 - 4.0 K/uL   Monocytes Relative 4 %   Monocytes Absolute 0.5 0.1 - 1.0 K/uL   Eosinophils Relative 2 %   Eosinophils Absolute 0.2 0.0 - 0.7 K/uL   Basophils Relative 0 %   Basophils Absolute 0.0 0.0 - 0.1 K/uL  Comprehensive metabolic panel     Status: Abnormal   Collection Time: 08/20/16  3:38 PM  Result Value Ref Range   Sodium 138 135 - 145 mmol/L   Potassium 3.4 (L) 3.5 - 5.1 mmol/L   Chloride 103 101 - 111 mmol/L   CO2 24 22 - 32 mmol/L   Glucose, Bld 92 65 - 99 mg/dL   BUN 8 6 - 20 mg/dL   Creatinine, Ser 0.67 0.44 - 1.00 mg/dL   Calcium 9.1  8.9 - 10.3 mg/dL   Total Protein 6.3 (L) 6.5 - 8.1 g/dL   Albumin 3.3 (L) 3.5 - 5.0 g/dL   AST 25 15 - 41 U/L   ALT 15 14 - 54 U/L   Alkaline Phosphatase 44 38 - 126 U/L   Total Bilirubin 0.4 0.3 - 1.2 mg/dL   GFR calc non Af Amer >60 >60 mL/min   GFR calc Af Amer >60 >60 mL/min    Comment: (NOTE) The eGFR has been calculated using the CKD EPI equation. This calculation has not been validated in all clinical situations. eGFR's persistently <60 mL/min signify possible Chronic Kidney Disease.    Anion gap 11 5 - 15  Magnesium     Status: None   Collection Time: 08/20/16  9:11 PM  Result Value Ref Range   Magnesium 1.7 1.7 - 2.4 mg/dL  I-stat chem 8, ed     Status: Abnormal   Collection Time: 08/20/16  9:24 PM  Result Value Ref Range   Sodium 142 135 - 145 mmol/L   Potassium 3.2 (L) 3.5 - 5.1 mmol/L   Chloride 103 101 - 111 mmol/L   BUN 3 (L) 6 - 20 mg/dL   Creatinine, Ser 0.50 0.44 - 1.00 mg/dL   Glucose, Bld 107 (H) 65 - 99 mg/dL   Calcium, Ion 1.15 1.15 - 1.40 mmol/L   TCO2 25 0 - 100 mmol/L   Hemoglobin 8.8 (L) 12.0 - 15.0 g/dL   HCT 26.0 (L) 36.0 - 00.8 %  Basic metabolic panel     Status: None   Collection Time: 08/25/16  9:52 AM  Result Value Ref Range   Glucose 91 65 - 99 mg/dL   BUN 9 6 - 20 mg/dL   Creatinine, Ser 0.75 0.57 - 1.00 mg/dL   GFR calc non Af Amer 104 >59 mL/min/1.73   GFR calc Af Amer 120 >59 mL/min/1.73  BUN/Creatinine Ratio 12 9 - 23   Sodium 141 134 - 144 mmol/L   Potassium 4.8 3.5 - 5.2 mmol/L   Chloride 102 96 - 106 mmol/L   CO2 21 20 - 29 mmol/L   Calcium 10.0 8.7 - 10.2 mg/dL  CBC w/Diff     Status: Abnormal   Collection Time: 08/26/16 11:41 AM  Result Value Ref Range   WBC 12.0 (H) 4.0 - 10.5 K/uL   RBC 3.51 (L) 3.87 - 5.11 Mil/uL   Hemoglobin 9.7 (L) 12.0 - 15.0 g/dL   HCT 29.5 (L) 36.0 - 46.0 %   MCV 84.0 78.0 - 100.0 fl   MCHC 33.0 30.0 - 36.0 g/dL   RDW 14.7 11.5 - 15.5 %   Platelets 517.0 (H) 150.0 - 400.0 K/uL   Neutrophils  Relative % 69.2 43.0 - 77.0 %   Lymphocytes Relative 23.0 12.0 - 46.0 %   Monocytes Relative 4.9 3.0 - 12.0 %   Eosinophils Relative 2.4 0.0 - 5.0 %   Basophils Relative 0.5 0.0 - 3.0 %   Neutro Abs 8.3 (H) 1.4 - 7.7 K/uL   Lymphs Abs 2.8 0.7 - 4.0 K/uL   Monocytes Absolute 0.6 0.1 - 1.0 K/uL   Eosinophils Absolute 0.3 0.0 - 0.7 K/uL   Basophils Absolute 0.1 0.0 - 0.1 K/uL  CBC w/Diff     Status: Abnormal   Collection Time: 09/03/16 11:17 AM  Result Value Ref Range   WBC 7.9 4.0 - 10.5 K/uL   RBC 3.73 (L) 3.87 - 5.11 Mil/uL   Hemoglobin 10.1 (L) 12.0 - 15.0 g/dL   HCT 31.2 (L) 36.0 - 46.0 %   MCV 83.6 78.0 - 100.0 fl   MCHC 32.3 30.0 - 36.0 g/dL   RDW 14.5 11.5 - 15.5 %   Platelets 406.0 (H) 150.0 - 400.0 K/uL   Neutrophils Relative % 68.5 43.0 - 77.0 %   Lymphocytes Relative 21.9 12.0 - 46.0 %   Monocytes Relative 5.7 3.0 - 12.0 %   Eosinophils Relative 3.2 0.0 - 5.0 %   Basophils Relative 0.7 0.0 - 3.0 %   Neutro Abs 5.4 1.4 - 7.7 K/uL   Lymphs Abs 1.7 0.7 - 4.0 K/uL   Monocytes Absolute 0.4 0.1 - 1.0 K/uL   Eosinophils Absolute 0.3 0.0 - 0.7 K/uL   Basophils Absolute 0.1 0.0 - 0.1 K/uL  Comp Met (CMET)     Status: None   Collection Time: 09/03/16 11:17 AM  Result Value Ref Range   Sodium 140 135 - 145 mEq/L   Potassium 4.5 3.5 - 5.1 mEq/L   Chloride 106 96 - 112 mEq/L   CO2 25 19 - 32 mEq/L   Glucose, Bld 98 70 - 99 mg/dL   BUN 8 6 - 23 mg/dL   Creatinine, Ser 0.61 0.40 - 1.20 mg/dL   Total Bilirubin 0.3 0.2 - 1.2 mg/dL   Alkaline Phosphatase 54 39 - 117 U/L   AST 10 0 - 37 U/L   ALT 11 0 - 35 U/L   Total Protein 6.5 6.0 - 8.3 g/dL   Albumin 4.0 3.5 - 5.2 g/dL   Calcium 9.7 8.4 - 10.5 mg/dL   GFR 119.07 >60.00 mL/min  Basic metabolic panel     Status: Abnormal   Collection Time: 09/06/16  9:23 AM  Result Value Ref Range   Glucose 116 (H) 65 - 99 mg/dL   BUN 8 6 - 20 mg/dL   Creatinine, Ser 0.66 0.57 -  1.00 mg/dL   GFR calc non Af Amer 116 >59 mL/min/1.73    GFR calc Af Amer 133 >59 mL/min/1.73   BUN/Creatinine Ratio 12 9 - 23   Sodium 138 134 - 144 mmol/L   Potassium 4.6 3.5 - 5.2 mmol/L   Chloride 102 96 - 106 mmol/L   CO2 20 20 - 29 mmol/L   Calcium 9.6 8.7 - 10.2 mg/dL  Magnesium     Status: None   Collection Time: 09/06/16  9:23 AM  Result Value Ref Range   Magnesium 2.1 1.6 - 2.3 mg/dL  TSH     Status: None   Collection Time: 09/06/16  9:27 AM  Result Value Ref Range   TSH 1.850 0.450 - 4.500 uIU/mL  CBC w/Diff     Status: Abnormal   Collection Time: 09/20/16  2:14 PM  Result Value Ref Range   WBC 9.8 4.0 - 10.5 K/uL   RBC 4.40 3.87 - 5.11 Mil/uL   Hemoglobin 11.8 (L) 12.0 - 15.0 g/dL   HCT 37.1 36.0 - 46.0 %   MCV 84.2 78.0 - 100.0 fl   MCHC 31.7 30.0 - 36.0 g/dL   RDW 14.9 11.5 - 15.5 %   Platelets 435.0 (H) 150.0 - 400.0 K/uL   Neutrophils Relative % 74.2 43.0 - 77.0 %   Lymphocytes Relative 18.2 12.0 - 46.0 %   Monocytes Relative 4.8 3.0 - 12.0 %   Eosinophils Relative 2.3 0.0 - 5.0 %   Basophils Relative 0.5 0.0 - 3.0 %   Neutro Abs 7.3 1.4 - 7.7 K/uL   Lymphs Abs 1.8 0.7 - 4.0 K/uL   Monocytes Absolute 0.5 0.1 - 1.0 K/uL   Eosinophils Absolute 0.2 0.0 - 0.7 K/uL   Basophils Absolute 0.1 0.0 - 0.1 K/uL  Comp Met (CMET)     Status: None   Collection Time: 09/20/16  2:14 PM  Result Value Ref Range   Sodium 137 135 - 145 mEq/L   Potassium 4.4 3.5 - 5.1 mEq/L   Chloride 103 96 - 112 mEq/L   CO2 24 19 - 32 mEq/L   Glucose, Bld 90 70 - 99 mg/dL   BUN 8 6 - 23 mg/dL   Creatinine, Ser 0.56 0.40 - 1.20 mg/dL   Total Bilirubin 0.3 0.2 - 1.2 mg/dL   Alkaline Phosphatase 62 39 - 117 U/L   AST 13 0 - 37 U/L   ALT 15 0 - 35 U/L   Total Protein 6.5 6.0 - 8.3 g/dL   Albumin 4.4 3.5 - 5.2 g/dL   Calcium 9.6 8.4 - 10.5 mg/dL   GFR 131.39 >60.00 mL/min  Ferritin     Status: None   Collection Time: 09/20/16  2:14 PM  Result Value Ref Range   Ferritin 13.9 10.0 - 291.0 ng/mL  CBC w/Diff     Status: Abnormal   Collection  Time: 09/27/16  1:57 PM  Result Value Ref Range   WBC 8.5 4.0 - 10.5 K/uL   RBC 4.46 3.87 - 5.11 Mil/uL   Hemoglobin 12.0 12.0 - 15.0 g/dL   HCT 37.2 36.0 - 46.0 %   MCV 83.4 78.0 - 100.0 fl   MCHC 32.1 30.0 - 36.0 g/dL   RDW 14.8 11.5 - 15.5 %   Platelets 426.0 (H) 150.0 - 400.0 K/uL   Neutrophils Relative % 66.9 43.0 - 77.0 %   Lymphocytes Relative 23.4 12.0 - 46.0 %   Monocytes Relative 6.1 3.0 - 12.0 %  Eosinophils Relative 2.8 0.0 - 5.0 %   Basophils Relative 0.8 0.0 - 3.0 %   Neutro Abs 5.7 1.4 - 7.7 K/uL   Lymphs Abs 2.0 0.7 - 4.0 K/uL   Monocytes Absolute 0.5 0.1 - 1.0 K/uL   Eosinophils Absolute 0.2 0.0 - 0.7 K/uL   Basophils Absolute 0.1 0.0 - 0.1 K/uL    Assessment/Plan: 1. Iron deficiency anemia, unspecified iron deficiency anemia type Was resolving. Likely slightly declined secondary to heavy bleed. GYN has been consulted and has given patient instructions for follow-up. Will continued TID Iron supplementation during menstrual cycle. Repeat CBC today.  - CBC w/Diff  2. Palpitations Holter monitor in place. Patient asymptomatic at present. Has follow-up with Cardiology. Will continue low-dose amlodipine per Cardiology. Holter will give more input into what exactly is occurring when she notes palpitations. Strict ER precautions reviewed with patient.   Leeanne Rio, PA-C

## 2016-09-29 ENCOUNTER — Other Ambulatory Visit: Payer: Self-pay

## 2016-09-29 ENCOUNTER — Telehealth: Payer: Self-pay

## 2016-09-29 NOTE — Telephone Encounter (Signed)
Pt called stating that 2 week Event Monitor was placed and due to misunderstanding to the length of time it should be worn pt was told to ware it for 30 days for insurance reasons. Pt stated today marks 2 weeks and she didn't want to have to ware it longer than she needed to. And would like to have results back before her next appt on the 27th with Revankar.   I asked Dr. Tomie China and he stated that it 2 weeks is fine.   Informed pt what Revankar said and that at this point it's her choice since its the last day but there will still be a charge to insurance for 30 days for event monitors regardless of any length of time before 30 days. Pt verbalized understanding.

## 2016-10-04 ENCOUNTER — Ambulatory Visit (INDEPENDENT_AMBULATORY_CARE_PROVIDER_SITE_OTHER): Payer: BLUE CROSS/BLUE SHIELD | Admitting: Cardiology

## 2016-10-04 ENCOUNTER — Encounter: Payer: Self-pay | Admitting: *Deleted

## 2016-10-04 ENCOUNTER — Telehealth: Payer: Self-pay | Admitting: *Deleted

## 2016-10-04 ENCOUNTER — Encounter: Payer: Self-pay | Admitting: Cardiology

## 2016-10-04 VITALS — BP 116/72 | HR 76 | Resp 10 | Ht 69.0 in | Wt 238.4 lb

## 2016-10-04 DIAGNOSIS — I10 Essential (primary) hypertension: Secondary | ICD-10-CM

## 2016-10-04 DIAGNOSIS — R002 Palpitations: Secondary | ICD-10-CM

## 2016-10-04 NOTE — Telephone Encounter (Signed)
Letter has been completed and faxed to [phone] Jeneen Montgomery, per patient request.

## 2016-10-04 NOTE — Progress Notes (Signed)
Cardiology Office Note:    Date:  10/04/2016   ID:  [name], DOB 12-24-1980, MRN [id number]  PCP:  Waldon Merl, PA-C  Cardiologist:  Garwin Brothers, MD   Referring MD: Waldon Merl, PA-C    ASSESSMENT:    1. Essential hypertension   2. Palpitations    PLAN:    In order of problems listed above:  1. Patient was reassured. Her blood pressure stable. She has not felt any palpitations as of now. These have resolved. Event monitoring is still pending. I reviewed the records. We will give her a call about this. She'll be seen in follow-up appointment in 3 months or earlier if she has any concerns. She's been advised to continue potassium supplementation. Diet was discussed for obesity and risks of obesity explained and she vocalized understanding.   Medication Adjustments/Labs and Tests Ordered: Current medicines are reviewed at length with the patient today.  Concerns regarding medicines are outlined above.  No orders of the defined types were placed in this encounter.  No orders of the defined types were placed in this encounter.    Chief Complaint  Patient presents with  . 4 week follow up     History of Present Illness:    Dominique Hughes is a 36 y.o. female . She was evaluated by me for palpitations. She also has history of essential hypertension. She's had a pregnancy and a miscarriage and she settled down from his issues. She denies any problems at this time and takes care of activities of daily living. No chest pain orthopnea or PND. She is walking on a regular basis. At the time of my evaluation she is alert awake oriented and in no distress.  Past Medical History:  Diagnosis Date  . Anemia   . Attention deficit disorder (ADD)   . Fever of unknown origin (FUO)   . Headache    Migaines history   . Hypertension   . PONV (postoperative nausea and vomiting)     Past Surgical History:  Procedure Laterality Date  . CESAREAN SECTION    .  DIAPHRAGM SURGERY    . NISSEN FUNDOPLICATION    . SHOULDER SURGERY Right   . SPINAL CORD STIMULATOR INSERTION    . SPINE SURGERY     L5/S1  . TONSILLECTOMY      Current Medications: No outpatient prescriptions have been marked as taking for the 10/04/16 encounter (Office Visit) with Glorya Bartley, Aundra Dubin, MD.     Allergies:   Amoxicillin; Penicillins; and Apricot flavor   Social History   Social History  . Marital status: Single    Spouse name: N/A  . Number of children: N/A  . Years of education: N/A   Social History Main Topics  . Smoking status: Never Smoker  . Smokeless tobacco: Never Used  . Alcohol use No  . Drug use: No  . Sexual activity: Not Currently    Partners: Male   Other Topics Concern  . None   Social History Narrative  . None     Family History: The patient's family history includes Cancer in her father, maternal grandmother, and paternal grandmother; Heart disease in her father and mother; Heart failure in her father and mother; Hypertension in her father and mother.  ROS:   Please see the history of present illness.    All other systems reviewed and are negative.  EKGs/Labs/Other Studies Reviewed:    The following studies were reviewed today: I discussed  my findings with the patient at extensive length. Blood work reports were also discussed. She had multiple questions which were answered to her satisfaction.   Recent Labs: 09/06/2016: Magnesium 2.1; TSH 1.850 09/20/2016: ALT 15; BUN 8; Creatinine, Ser 0.56; Potassium 4.4; Sodium 137 09/27/2016: Hemoglobin 12.0; Platelets 426.0  Recent Lipid Panel No results found for: CHOL, TRIG, HDL, CHOLHDL, VLDL, LDLCALC, LDLDIRECT  Physical Exam:    VS:  BP 116/72   Pulse 76   Resp 10   Ht 5\' 9"  (1.753 m)   Wt 238 lb 6.4 oz (108.1 kg)   BMI 35.21 kg/m     Wt Readings from Last 3 Encounters:  10/04/16 238 lb 6.4 oz (108.1 kg)  09/27/16 237 lb (107.5 kg)  09/20/16 235 lb (106.6 kg)     GEN:  Patient is in no acute distress HEENT: Normal NECK: No JVD; No carotid bruits LYMPHATICS: No lymphadenopathy CARDIAC: Hear sounds regular, 2/6 systolic murmur at the apex. RESPIRATORY:  Clear to auscultation without rales, wheezing or rhonchi  ABDOMEN: Soft, non-tender, non-distended MUSCULOSKELETAL:  No edema; No deformity  SKIN: Warm and dry NEUROLOGIC:  Alert and oriented x 3 PSYCHIATRIC:  Normal affect   Signed, Garwin Brothers, MD  10/04/2016 10:02 AM     [facility]

## 2016-10-04 NOTE — Telephone Encounter (Signed)
Ok to provide note to return to work effective today as long as pt feels she is able to do so

## 2016-10-04 NOTE — Patient Instructions (Signed)
Medication Instructions:  Your physician recommends that you continue on your current medications as directed. Please refer to the Current Medication list given to you today.   Labwork: None  Testing/Procedures: None  Follow-Up: Your physician recommends that you schedule a follow-up appointment in: 3 months  Any Other Special Instructions Will Be Listed Below (If Applicable).     If you need a refill on your cardiac medications before your next appointment, please call your pharmacy.   

## 2016-10-04 NOTE — Telephone Encounter (Signed)
Patient calling to see if she could get a return to work note.  Patient states that she was told by Selena Batten to call on Monday and she could get a note to return to work.  Patient states that work will not accept note from Cardiology because Selena Batten was the one taking her out.    Patient is asking if there is any way we can give her a note to return to work so that she can go back today.   Routed to provider in the office to advise.

## 2016-10-20 ENCOUNTER — Telehealth: Payer: Self-pay

## 2016-10-20 NOTE — Telephone Encounter (Signed)
Called pt to inform her of results form cardiac event monitor. Pt verbalized understanding and requested that a copy of this be mailed to her.

## 2016-11-26 ENCOUNTER — Encounter (HOSPITAL_COMMUNITY): Payer: Self-pay | Admitting: Obstetrics and Gynecology

## 2017-01-04 ENCOUNTER — Ambulatory Visit: Payer: BLUE CROSS/BLUE SHIELD | Admitting: Cardiology

## 2017-01-18 ENCOUNTER — Ambulatory Visit: Payer: BLUE CROSS/BLUE SHIELD | Admitting: Cardiology

## 2017-01-27 ENCOUNTER — Ambulatory Visit: Payer: BLUE CROSS/BLUE SHIELD | Admitting: Cardiology

## 2017-02-10 ENCOUNTER — Ambulatory Visit: Payer: BLUE CROSS/BLUE SHIELD | Admitting: Cardiology

## 2017-02-23 ENCOUNTER — Ambulatory Visit: Payer: BLUE CROSS/BLUE SHIELD | Admitting: Cardiology

## 2017-10-19 ENCOUNTER — Ambulatory Visit: Payer: BLUE CROSS/BLUE SHIELD | Admitting: Cardiology

## 2017-10-19 ENCOUNTER — Encounter: Payer: Self-pay | Admitting: Cardiology

## 2017-10-19 VITALS — BP 132/80 | HR 87 | Ht 69.0 in | Wt 250.0 lb

## 2017-10-19 DIAGNOSIS — I10 Essential (primary) hypertension: Secondary | ICD-10-CM | POA: Diagnosis not present

## 2017-10-19 DIAGNOSIS — Z13220 Encounter for screening for lipoid disorders: Secondary | ICD-10-CM | POA: Diagnosis not present

## 2017-10-19 DIAGNOSIS — R002 Palpitations: Secondary | ICD-10-CM

## 2017-10-19 MED ORDER — AMLODIPINE BESYLATE 5 MG PO TABS: 2.5000 mg | ORAL_TABLET | Freq: Every day | ORAL | 1 refills | Status: DC

## 2017-10-19 NOTE — Progress Notes (Signed)
Cardiology Office Note:    Date:  10/19/2017   ID:  Dominique Hughes, DOB [birth year], MRN [id number]  PCP:  Waldon Merl, PA-C  Cardiologist:  Garwin Brothers, MD   Referring MD: Waldon Merl, PA-C    ASSESSMENT:    1. Essential hypertension   2. Palpitations    PLAN:    In order of problems listed above:  1. Memory prevention stressed with the patient.  Importance of compliance with diet and medication stressed and she vocalized understanding.  Her blood pressure is stable.  Diet was discussed for weight loss and she vocalized understanding.  She is a very motivated lady and exercises on a regular basis. 2. She will have blood work today as she is fasting she will also have magnesium level drawn. 3. Patient will be seen in follow-up appointment in 6 months or earlier if the patient has any concerns 4. She is concerned about her bradycardia and her heart rate is in her 50s at home and at rest and I told her that is probably the result of her being very fit physically and exercising regularly and reassured her.   Medication Adjustments/Labs and Tests Ordered: Current medicines are reviewed at length with the patient today.  Concerns regarding medicines are outlined above.  No orders of the defined types were placed in this encounter.  No orders of the defined types were placed in this encounter.    No chief complaint on file.    History of Present Illness:    Dominique Hughes is a 37 y.o. female.  The patient had a history of palpitations and essential hypertension.  Subsequently she has done fine her palpitations have resolved.  She exercises religiously on a regular basis and swims 2.  With this she has no symptoms.  No chest pain orthopnea or PND.  At the time of my evaluation, the patient is alert awake oriented and in no distress.  Past Medical History:  Diagnosis Date  . Anemia   . Attention deficit disorder (ADD)   . Fever of unknown origin (FUO)   .  Headache    Migaines history   . Hypertension   . PONV (postoperative nausea and vomiting)     Past Surgical History:  Procedure Laterality Date  . CESAREAN SECTION    . DIAPHRAGM SURGERY    . DILATION AND CURETTAGE OF UTERUS N/A 08/16/2016   Procedure: DILATATION AND CURETTAGE;  Surgeon: Waynard Reeds, MD;  Location: [facility] BIRTHING SUITES;  Service: Gynecology;  Laterality: N/A;  . NISSEN FUNDOPLICATION    . SHOULDER SURGERY Right   . SPINAL CORD STIMULATOR INSERTION    . SPINE SURGERY     L5/S1  . TONSILLECTOMY      Current Medications: Current Meds  Medication Sig  . Prenatal Vit-Fe Fumarate-FA (PRENATAL MULTIVITAMIN) TABS tablet Take 1 tablet by mouth daily at 12 noon.     Allergies:   Amoxicillin; Penicillins; and Apricot flavor   Social History   Socioeconomic History  . Marital status: Single    Spouse name: Not on file  . Number of children: Not on file  . Years of education: Not on file  . Highest education level: Not on file  Occupational History  . Not on file  Social Needs  . Financial resource strain: Not on file  . Food insecurity:    Worry: Not on file    Inability: Not on file  . Transportation needs:    Medical:  Not on file    Non-medical: Not on file  Tobacco Use  . Smoking status: Never Smoker  . Smokeless tobacco: Never Used  Substance and Sexual Activity  . Alcohol use: No  . Drug use: No  . Sexual activity: Not Currently    Partners: Male  Lifestyle  . Physical activity:    Days per week: Not on file    Minutes per session: Not on file  . Stress: Not on file  Relationships  . Social connections:    Talks on phone: Not on file    Gets together: Not on file    Attends religious service: Not on file    Active member of club or organization: Not on file    Attends meetings of clubs or organizations: Not on file    Relationship status: Not on file  Other Topics Concern  . Not on file  Social History Narrative  . Not on file      Family History: The patient's family history includes Cancer in her father, maternal grandmother, and paternal grandmother; Heart disease in her father and mother; Heart failure in her father and mother; Hypertension in her father and mother.  ROS:   Please see the history of present illness.    All other systems reviewed and are negative.  EKGs/Labs/Other Studies Reviewed:    The following studies were reviewed today: I discussed my findings with the patient at extensive length.   Recent Labs: No results found for requested labs within last 8760 hours.  Recent Lipid Panel No results found for: CHOL, TRIG, HDL, CHOLHDL, VLDL, LDLCALC, LDLDIRECT  Physical Exam:    VS:  BP 132/80 (BP Location: Right Arm, Patient Position: Sitting, Cuff Size: Normal)   Pulse 87   Ht 5\' 9"  (1.753 m)   Wt 250 lb (113.4 kg)   BMI 36.92 kg/m     Wt Readings from Last 3 Encounters:  10/19/17 250 lb (113.4 kg)  10/04/16 238 lb 6.4 oz (108.1 kg)  09/27/16 237 lb (107.5 kg)     GEN: Patient is in no acute distress HEENT: Normal NECK: No JVD; No carotid bruits LYMPHATICS: No lymphadenopathy CARDIAC: Hear sounds regular, 2/6 systolic murmur at the apex. RESPIRATORY:  Clear to auscultation without rales, wheezing or rhonchi  ABDOMEN: Soft, non-tender, non-distended MUSCULOSKELETAL:  No edema; No deformity  SKIN: Warm and dry NEUROLOGIC:  Alert and oriented x 3 PSYCHIATRIC:  Normal affect   Signed, Garwin Brothers, MD  10/19/2017 9:39 AM    [facility]

## 2017-10-19 NOTE — Addendum Note (Signed)
Addended by: Craige Cotta on: 10/19/2017 09:50 AM   Modules accepted: Orders

## 2017-10-19 NOTE — Patient Instructions (Signed)
Medication Instructions:  Your physician recommends that you continue on your current medications as directed. Please refer to the Current Medication list given to you today.  Labwork: Your physician recommends that you have the following labs drawn: CBC, BMP, TSH, Magnesium, liver and lipid panel.  Testing/Procedures: None  Follow-Up: Your physician recommends that you schedule a follow-up appointment in: 1 year  Any Other Special Instructions Will Be Listed Below (If Applicable).     If you need a refill on your cardiac medications before your next appointment, please call your pharmacy.   [facility]  Garey Ham, RN, BSN

## 2017-10-19 NOTE — Addendum Note (Signed)
Addended by: Craige Cotta on: 10/19/2017 09:57 AM   Modules accepted: Orders

## 2017-10-20 LAB — HEPATIC FUNCTION PANEL
ALK PHOS: 71 IU/L (ref 39–117)
ALT: 25 IU/L (ref 0–32)
AST: 19 IU/L (ref 0–40)
Albumin: 4.4 g/dL (ref 3.5–5.5)
BILIRUBIN, DIRECT: 0.11 mg/dL (ref 0.00–0.40)
Bilirubin Total: 0.3 mg/dL (ref 0.0–1.2)
Total Protein: 6.9 g/dL (ref 6.0–8.5)

## 2017-10-20 LAB — CBC WITH DIFFERENTIAL/PLATELET
BASOS ABS: 0.1 10*3/uL (ref 0.0–0.2)
Basos: 1 %
EOS (ABSOLUTE): 0.4 10*3/uL (ref 0.0–0.4)
Eos: 5 %
HEMATOCRIT: 39.5 % (ref 34.0–46.6)
HEMOGLOBIN: 12.7 g/dL (ref 11.1–15.9)
Immature Grans (Abs): 0 10*3/uL (ref 0.0–0.1)
Immature Granulocytes: 0 %
LYMPHS ABS: 2.5 10*3/uL (ref 0.7–3.1)
Lymphs: 28 %
MCH: 26.6 pg (ref 26.6–33.0)
MCHC: 32.2 g/dL (ref 31.5–35.7)
MCV: 83 fL (ref 79–97)
MONOCYTES: 6 %
MONOS ABS: 0.5 10*3/uL (ref 0.1–0.9)
NEUTROS ABS: 5.2 10*3/uL (ref 1.4–7.0)
Neutrophils: 60 %
Platelets: 406 10*3/uL (ref 150–450)
RBC: 4.78 x10E6/uL (ref 3.77–5.28)
RDW: 13.8 % (ref 12.3–15.4)
WBC: 8.7 10*3/uL (ref 3.4–10.8)

## 2017-10-20 LAB — BASIC METABOLIC PANEL
BUN / CREAT RATIO: 8 — AB (ref 9–23)
BUN: 6 mg/dL (ref 6–20)
CO2: 25 mmol/L (ref 20–29)
CREATININE: 0.74 mg/dL (ref 0.57–1.00)
Calcium: 9.7 mg/dL (ref 8.7–10.2)
Chloride: 103 mmol/L (ref 96–106)
GFR calc Af Amer: 121 mL/min/{1.73_m2} (ref >59)
GFR calc non Af Amer: 105 mL/min/{1.73_m2} (ref >59)
GLUCOSE: 110 mg/dL — AB (ref 65–99)
POTASSIUM: 4.5 mmol/L (ref 3.5–5.2)
SODIUM: 142 mmol/L (ref 134–144)

## 2017-10-20 LAB — TSH: TSH: 1.54 u[IU]/mL (ref 0.450–4.500)

## 2017-10-20 LAB — LIPID PANEL
CHOLESTEROL TOTAL: 198 mg/dL (ref 100–199)
Chol/HDL Ratio: 4.3 ratio (ref 0.0–4.4)
HDL: 46 mg/dL (ref >39)
LDL Calculated: 115 mg/dL — ABNORMAL HIGH (ref 0–99)
TRIGLYCERIDES: 186 mg/dL — AB (ref 0–149)
VLDL Cholesterol Cal: 37 mg/dL (ref 5–40)

## 2017-10-20 LAB — MAGNESIUM: MAGNESIUM: 2.1 mg/dL (ref 1.6–2.3)

## 2018-05-10 ENCOUNTER — Other Ambulatory Visit: Payer: Self-pay

## 2018-05-10 MED ORDER — AMLODIPINE BESYLATE 5 MG PO TABS: 5.0000 mg | ORAL_TABLET | Freq: Every day | ORAL | 2 refills | Status: DC

## 2018-05-10 NOTE — Telephone Encounter (Signed)
Patient request for refill on amlodipine approved and sent.

## 2018-10-09 ENCOUNTER — Telehealth: Payer: Self-pay | Admitting: Cardiology

## 2018-10-09 NOTE — Telephone Encounter (Signed)
°*  STAT* If patient is at the pharmacy, call can be transferred to refill team.   1. Which medications need to be refilled? (please list name of each medication and dose if known) Amlodipine 5mg  tablets  2. Which pharmacy/location (including street and city if local pharmacy) is medication to be sent to?[facility] [place]  3. Do they need a 30 day or 90 day supply? [place]

## 2018-10-11 ENCOUNTER — Other Ambulatory Visit: Payer: Self-pay | Admitting: *Deleted

## 2018-10-11 MED ORDER — AMLODIPINE BESYLATE 5 MG PO TABS: 5.0000 mg | ORAL_TABLET | Freq: Every day | ORAL | 0 refills | Status: DC

## 2018-10-11 NOTE — Telephone Encounter (Signed)
Refill sent.

## 2018-12-04 ENCOUNTER — Ambulatory Visit (INDEPENDENT_AMBULATORY_CARE_PROVIDER_SITE_OTHER): Payer: BLUE CROSS/BLUE SHIELD | Admitting: Cardiology

## 2018-12-04 ENCOUNTER — Encounter: Payer: Self-pay | Admitting: Cardiology

## 2018-12-04 ENCOUNTER — Other Ambulatory Visit: Payer: Self-pay

## 2018-12-04 VITALS — BP 142/108 | HR 86 | Ht 68.0 in | Wt 238.0 lb

## 2018-12-04 DIAGNOSIS — R002 Palpitations: Secondary | ICD-10-CM

## 2018-12-04 DIAGNOSIS — I10 Essential (primary) hypertension: Secondary | ICD-10-CM | POA: Diagnosis not present

## 2018-12-04 NOTE — Addendum Note (Signed)
Addended by: Beckey Rutter on: 12/04/2018 03:33 PM   Modules accepted: Orders

## 2018-12-04 NOTE — Patient Instructions (Signed)

## 2018-12-04 NOTE — Progress Notes (Signed)
Cardiology Office Note:    Date:  12/04/2018   ID:  Dominique Hughes, DOB 09-11-1980, MRN [id number]  PCP:  Brunetta Jeans, PA-C  Cardiologist:  Jenean Lindau, MD   Referring MD: Brunetta Jeans, PA-C    ASSESSMENT:    1. Essential hypertension   2. Palpitations    PLAN:    In order of problems listed above:  1. I discussed my findings with the patient at extensive length.  Her blood pressure is elevated.  I would rather up her amlodipine but in view of the issues mentioned above I will wait for a call from her reproductive specialist.  I gave her my cell phone number to give her to the doctor to give me a call to discuss her case.  I would hesitate to initiate her on Procardia or labetalol in view of significant bradycardia that she has documented.  She is asymptomatic overall but I would not initiate a medication which is negatively chronotropic in the patient who has documented heart rates in the low 30s.  I discussed this with her at length and she vocalized understanding. 2. Patient will be seen in follow-up appointment in 6 months or earlier if the patient has any concerns    Medication Adjustments/Labs and Tests Ordered: Current medicines are reviewed at length with the patient today.  Concerns regarding medicines are outlined above.  No orders of the defined types were placed in this encounter.  No orders of the defined types were placed in this encounter.    No chief complaint on file.    History of Present Illness:    Dominique Hughes is a 38 y.o. female.  Patient has past medical history of essential hypertension.  She is on amlodipine.  She tells me that she is trying to get pregnant and working with the endocrinologist and a reproductive specialist.  She has been told that she will need to switch to labetalol or Procardia.  She also mentions to me that her heart rate dips into the low 30s at times when she is resting.  She is a Marine scientist by profession.  At the  time of my evaluation, the patient is alert awake oriented and in no distress.  Past Medical History:  Diagnosis Date  . Anemia   . Attention deficit disorder (ADD)   . Fever of unknown origin (FUO)   . Headache    Migaines history   . Hypertension   . PONV (postoperative nausea and vomiting)     Past Surgical History:  Procedure Laterality Date  . CESAREAN SECTION    . DIAPHRAGM SURGERY    . DILATION AND CURETTAGE OF UTERUS N/A 08/16/2016   Procedure: DILATATION AND CURETTAGE;  Surgeon: Vanessa Kick, MD;  Location: [place];  Service: Gynecology;  Laterality: N/A;  . NISSEN FUNDOPLICATION    . SHOULDER SURGERY Right   . SPINAL CORD STIMULATOR INSERTION    . SPINE SURGERY     L5/S1  . TONSILLECTOMY      Current Medications: Current Meds  Medication Sig  . acetaminophen (TYLENOL) 500 MG tablet Take 1 tablet by mouth every 6 (six) hours as needed.  Marland Kitchen amLODipine (NORVASC) 5 MG tablet Take 1 tablet (5 mg total) by mouth daily.  . Coenzyme Q10 (CO Q-10) 100 MG CAPS Take by mouth.  . NON FORMULARY 2,000 mg daily. Myo + D Chiro + Vitex; 3 capsules daily  . Prenatal Vit-Fe Fumarate-FA (PRENATAL MULTIVITAMIN) TABS tablet Take  1 tablet by mouth daily at 12 noon.     Allergies:   Amoxicillin, Penicillins, and Apricot flavor   Social History   Socioeconomic History  . Marital status: Single    Spouse name: Not on file  . Number of children: Not on file  . Years of education: Not on file  . Highest education level: Not on file  Occupational History  . Not on file  Social Needs  . Financial resource strain: Not on file  . Food insecurity    Worry: Not on file    Inability: Not on file  . Transportation needs    Medical: Not on file    Non-medical: Not on file  Tobacco Use  . Smoking status: Never Smoker  . Smokeless tobacco: Never Used  Substance and Sexual Activity  . Alcohol use: No  . Drug use: No  . Sexual activity: Not Currently    Partners: Male   Lifestyle  . Physical activity    Days per week: Not on file    Minutes per session: Not on file  . Stress: Not on file  Relationships  . Social Musician on phone: Not on file    Gets together: Not on file    Attends religious service: Not on file    Active member of club or organization: Not on file    Attends meetings of clubs or organizations: Not on file    Relationship status: Not on file  Other Topics Concern  . Not on file  Social History Narrative  . Not on file     Family History: The patient's family history includes Cancer in her father, maternal grandmother, and paternal grandmother; Heart disease in her father and mother; Heart failure in her father and mother; Hypertension in her father and mother.  ROS:   Please see the history of present illness.    All other systems reviewed and are negative.  EKGs/Labs/Other Studies Reviewed:    The following studies were reviewed today: As mentioned above.   Recent Labs: No results found for requested labs within last 8760 hours.  Recent Lipid Panel    Component Value Date/Time   CHOL 198 10/19/2017 0955   TRIG 186 (H) 10/19/2017 0955   HDL 46 10/19/2017 0955   CHOLHDL 4.3 10/19/2017 0955   LDLCALC 115 (H) 10/19/2017 0955    Physical Exam:    VS:  BP (!) 142/108 (BP Location: Left Arm, Patient Position: Sitting, Cuff Size: Normal)   Pulse 86   Ht 5\' 8"  (1.727 m)   Wt 238 lb (108 kg)   SpO2 99%   BMI 36.19 kg/m     Wt Readings from Last 3 Encounters:  12/04/18 238 lb (108 kg)  10/19/17 250 lb (113.4 kg)  10/04/16 238 lb 6.4 oz (108.1 kg)     GEN: Patient is in no acute distress HEENT: Normal NECK: No JVD; No carotid bruits LYMPHATICS: No lymphadenopathy CARDIAC: Hear sounds regular, 2/6 systolic murmur at the apex. RESPIRATORY:  Clear to auscultation without rales, wheezing or rhonchi  ABDOMEN: Soft, non-tender, non-distended MUSCULOSKELETAL:  No edema; No deformity  SKIN: Warm and dry  NEUROLOGIC:  Alert and oriented x 3 PSYCHIATRIC:  Normal affect   Signed, 10/06/16, MD  12/04/2018 3:17 PM    [facility]

## 2018-12-08 ENCOUNTER — Telehealth: Payer: Self-pay | Admitting: Cardiology

## 2018-12-08 NOTE — Telephone Encounter (Signed)
Bartolo Darter, this patient called in stating that Dr. Geraldo Pitter was supposed to contact her Dr. At [facility]. Note states [facility] tried and could not get ahold of Dr. Apparently her doctor tried several times on [facility] cell but no answer. Is there any way that you can put the two of them on the phone today? Patient was in tears over this not being taken care of.

## 2018-12-14 MED ORDER — AMLODIPINE BESYLATE 10 MG PO TABS: 10.0000 mg | ORAL_TABLET | Freq: Every day | ORAL | 1 refills | Status: DC

## 2018-12-14 NOTE — Telephone Encounter (Signed)
Per Dr. Docia Furl, patient should increase amlodipine to 10 mg daily and keep tab on BP. Instructed to walk 30 mins minimum daily and start a low sodium diet.

## 2018-12-19 ENCOUNTER — Telehealth: Payer: Self-pay | Admitting: Physician Assistant

## 2018-12-19 NOTE — Telephone Encounter (Signed)
LM asking pt to call back to let us know if she still plans on using [facility] as her PCP and if so to please schedule a CPE appt with him. ELEA  °

## 2019-05-24 ENCOUNTER — Other Ambulatory Visit: Payer: Self-pay | Admitting: Cardiology

## 2019-06-05 ENCOUNTER — Other Ambulatory Visit: Payer: Self-pay | Admitting: Cardiology

## 2019-06-12 ENCOUNTER — Ambulatory Visit: Payer: BLUE CROSS/BLUE SHIELD | Admitting: Cardiology

## 2019-06-15 ENCOUNTER — Encounter: Payer: Self-pay | Admitting: Cardiology

## 2019-06-15 ENCOUNTER — Other Ambulatory Visit: Payer: Self-pay

## 2019-06-15 ENCOUNTER — Ambulatory Visit: Payer: BLUE CROSS/BLUE SHIELD | Admitting: Cardiology

## 2019-06-15 ENCOUNTER — Ambulatory Visit (INDEPENDENT_AMBULATORY_CARE_PROVIDER_SITE_OTHER): Payer: BLUE CROSS/BLUE SHIELD

## 2019-06-15 VITALS — BP 140/92 | HR 64 | Ht 68.0 in | Wt 238.0 lb

## 2019-06-15 DIAGNOSIS — I493 Ventricular premature depolarization: Secondary | ICD-10-CM

## 2019-06-15 DIAGNOSIS — Z3A13 13 weeks gestation of pregnancy: Secondary | ICD-10-CM

## 2019-06-15 DIAGNOSIS — I10 Essential (primary) hypertension: Secondary | ICD-10-CM | POA: Diagnosis not present

## 2019-06-15 DIAGNOSIS — Z3490 Encounter for supervision of normal pregnancy, unspecified, unspecified trimester: Secondary | ICD-10-CM | POA: Insufficient documentation

## 2019-06-15 MED ORDER — METHYLDOPA 250 MG PO TABS: 250.0000 mg | ORAL_TABLET | Freq: Two times a day (BID) | ORAL | 3 refills | Status: AC

## 2019-06-15 NOTE — Progress Notes (Signed)
Cardiology Office Note:    Date:  06/15/2019   ID:  Dominique Hughes, DOB 09-14-1980, MRN [id number]  PCP:  Waldon Merl, PA-C  Cardiologist:  Garwin Brothers, MD   Referring MD: Waldon Merl, PA-C    ASSESSMENT:    1. Essential hypertension   2. [redacted] weeks gestation of pregnancy   3. Frequent PVCs    PLAN:    In order of problems listed above:  1. Essential hypertension and hypertension during pregnancy: I discussed my findings with the patient at extensive length.  Diet was emphasized.  Patient is on amlodipine from before pregnancy.  I do not have much options with her as the use of beta-blockers is not optimal for her because of issues with bradycardia in the past and I am really concerned about these issues during pregnancy.  I do not want them to recur..  Therefore at this time I will add methyldopa 250 mg twice daily to her regimen.  Benefits and potential risks explained to the patient.  Eventually if I can optimize her medications with methyldopa I will wean her off the amlodipine if possible but I would do that with extreme caution.  I want to see her in follow-up appointment in 2 weeks but I am here a month from now.  I will have her see my partner Dr. Servando Salina whose input will be valuable as she is interested in cardiovascular conditions during pregnancy and is in the process of establishing a program with our practice.  I discussed this with the patient and she is agreeable.  I told the patient to keep a close track of her blood pressures and bring her record for the next appointment with Dr. Servando Salina.  2. I obtained an EKG which revealed frequent PVCs.  Patient has history of frequent PVCs in the past.  I will do blood work which is Chem-7 magnesium and TSH.  I will do a 2-week monitor to assess her PVC burden.   Medication Adjustments/Labs and Tests Ordered: Current medicines are reviewed at length with the patient today.  Concerns regarding medicines are outlined above.    Orders Placed This Encounter  Procedures  . Basic metabolic panel  . Magnesium  . TSH  . LONG TERM MONITOR-LIVE TELEMETRY (3-14 DAYS)  . EKG 12-Lead   Meds ordered this encounter  Medications  . methyldopa (ALDOMET) 250 MG tablet    Sig: Take 1 tablet (250 mg total) by mouth 2 (two) times daily.    Dispense:  60 tablet    Refill:  3     Chief Complaint  Patient presents with  . Follow-up     History of Present Illness:    Dominique Hughes is a 39 y.o. female.  Patient has been evaluated by me in the past for essential hypertension.  Now she comes to see me in follow-up.  And mentions to me that she is pregnant [redacted] weeks.  Her blood pressure is mildly elevated.  She wants it to be optimized.  She is referred by her obstetrician.  At the time of my evaluation, the patient is alert awake oriented and in no distress.  Patient is not on beta-blocker because of bradycardia issues in the past.  Past Medical History:  Diagnosis Date  . Anemia   . Attention deficit disorder (ADD)   . Fever of unknown origin (FUO)   . Headache    Migaines history   . Hypertension   . PONV (postoperative  nausea and vomiting)     Past Surgical History:  Procedure Laterality Date  . CESAREAN SECTION    . DIAPHRAGM SURGERY    . DILATION AND CURETTAGE OF UTERUS N/A 08/16/2016   Procedure: DILATATION AND CURETTAGE;  Surgeon: Waynard Reeds, MD;  Location: [facility] BIRTHING SUITES;  Service: Gynecology;  Laterality: N/A;  . NISSEN FUNDOPLICATION    . SHOULDER SURGERY Right   . SPINAL CORD STIMULATOR INSERTION    . SPINE SURGERY     L5/S1  . TONSILLECTOMY      Current Medications: Current Meds  Medication Sig  . acetaminophen (TYLENOL) 500 MG tablet Take 1 tablet by mouth every 6 (six) hours as needed.  Marland Kitchen amLODipine (NORVASC) 10 MG tablet TAKE 1 TABLET BY MOUTH EVERY DAY  . aspirin 81 MG chewable tablet Chew by mouth.  . Cholecalciferol 25 MCG (1000 UT) CHEW Chew by mouth.  . Coenzyme Q10 (CO Q-10) 100  MG CAPS Take by mouth.  . magnesium gluconate (MAGONATE) 500 MG tablet Take by mouth.  . NON FORMULARY 2,000 mg daily. Myo + D Chiro + Vitex; 3 capsules daily  . Prenatal Vit-Fe Fumarate-FA (PRENATAL MULTIVITAMIN) TABS tablet Take 1 tablet by mouth daily at 12 noon.  . vitamin B-12 (CYANOCOBALAMIN) 500 MCG tablet Take by mouth.     Allergies:   Amoxicillin, Penicillins, and Apricot flavor   Social History   Socioeconomic History  . Marital status: Single    Spouse name: Not on file  . Number of children: Not on file  . Years of education: Not on file  . Highest education level: Not on file  Occupational History  . Not on file  Tobacco Use  . Smoking status: Never Smoker  . Smokeless tobacco: Never Used  Substance and Sexual Activity  . Alcohol use: No  . Drug use: No  . Sexual activity: Not Currently    Partners: Male  Other Topics Concern  . Not on file  Social History Narrative  . Not on file   Social Determinants of Health   Financial Resource Strain:   . Difficulty of Paying Living Expenses:   Food Insecurity:   . Worried About Programme researcher, broadcasting/film/video in the Last Year:   . Barista in the Last Year:   Transportation Needs:   . Freight forwarder (Medical):   Marland Kitchen Lack of Transportation (Non-Medical):   Physical Activity:   . Days of Exercise per Week:   . Minutes of Exercise per Session:   Stress:   . Feeling of Stress :   Social Connections:   . Frequency of Communication with Friends and Family:   . Frequency of Social Gatherings with Friends and Family:   . Attends Religious Services:   . Active Member of Clubs or Organizations:   . Attends Banker Meetings:   Marland Kitchen Marital Status:      Family History: The patient's family history includes Cancer in her father, maternal grandmother, and paternal grandmother; Heart disease in her father and mother; Heart failure in her father and mother; Hypertension in her father and mother.  ROS:     Please see the history of present illness.    All other systems reviewed and are negative.  EKGs/Labs/Other Studies Reviewed:    The following studies were reviewed today: EKG reveals sinus rhythm with multiple PVCs.   Recent Labs: No results found for requested labs within last 8760 hours.  Recent Lipid Panel  Component Value Date/Time   CHOL 198 10/19/2017 0955   TRIG 186 (H) 10/19/2017 0955   HDL 46 10/19/2017 0955   CHOLHDL 4.3 10/19/2017 0955   LDLCALC 115 (H) 10/19/2017 0955    Physical Exam:    VS:  BP (!) 140/92   Pulse 64   Ht 5\' 8"  (1.727 m)   Wt 238 lb (108 kg)   SpO2 100%   BMI 36.19 kg/m     Wt Readings from Last 3 Encounters:  06/15/19 238 lb (108 kg)  12/04/18 238 lb (108 kg)  10/19/17 250 lb (113.4 kg)     GEN: Patient is in no acute distress HEENT: Normal NECK: No JVD; No carotid bruits LYMPHATICS: No lymphadenopathy CARDIAC: Hear sounds regular, 2/6 systolic murmur at the apex. RESPIRATORY:  Clear to auscultation without rales, wheezing or rhonchi  ABDOMEN: Soft, non-tender, non-distended MUSCULOSKELETAL:  No edema; No deformity  SKIN: Warm and dry NEUROLOGIC:  Alert and oriented x 3 PSYCHIATRIC:  Normal affect   Signed, Jenean Lindau, MD  06/15/2019 4:07 PM     [facility]

## 2019-06-15 NOTE — Patient Instructions (Addendum)
Medication Instructions:  Your physician has recommended you make the following change in your medication:   Start Methyldopa 250 mg twice daily.  *If you need a refill on your cardiac medications before your next appointment, please call your pharmacy*   Lab Work: Your physician recommends that you have a Bmet, TSH and Mg done in the office today.  If you have labs (blood work) drawn today and your tests are completely normal, you will receive your results only by: Marland Kitchen MyChart Message (if you have MyChart) OR . A paper copy in the mail If you have any lab test that is abnormal or we need to change your treatment, we will call you to review the results.   Testing/Procedures: You had an EKG today.  WHY IS MY DOCTOR PRESCRIBING ZIO? The Zio system is proven and trusted by physicians to detect and diagnose irregular heart rhythms -- and has been prescribed to hundreds of thousands of patients.  The FDA has cleared the Zio system to monitor for many different kinds of irregular heart rhythms. In a study, physicians were able to reach a diagnosis 90% of the time with the Zio system1.  You can wear the Zio monitor -- a small, discreet, comfortable patch -- during your normal day-to-day activity, including while you sleep, shower, and exercise, while it records every single heartbeat for analysis.  1Barrett, P., et al. Comparison of 24 Hour Holter Monitoring Versus 14 Day Novel Adhesive Patch Electrocardiographic Monitoring. American Journal of Medicine, 2014.  ZIO VS. HOLTER MONITORING The Zio monitor can be comfortably worn for up to 14 days. Holter monitors can be worn for 24 to 48 hours, limiting the time to record any irregular heart rhythms you may have. Zio is able to capture data for the 51% of patients who have their first symptom-triggered arrhythmia after 48 hours.1  LIVE WITHOUT RESTRICTIONS The Zio ambulatory cardiac monitor is a small, unobtrusive, and water-resistant patch--you  might even forget you're wearing it. The Zio monitor records and stores every beat of your heart, whether you're sleeping, working out, or showering.  Wear for 2 weeks and remove on 06/29/19.   Follow-Up: At [facility], you and your health needs are our priority.  As part of our continuing mission to provide you with exceptional heart care, we have created designated Provider Care Teams.  These Care Teams include your primary Cardiologist (physician) and Advanced Practice Providers (APPs -  Physician Assistants and Nurse Practitioners) who all work together to provide you with the care you need, when you need it.  We recommend signing up for the patient portal called "MyChart".  Sign up information is provided on this After Visit Summary.  MyChart is used to connect with patients for Virtual Visits (Telemedicine).  Patients are able to view lab/test results, encounter notes, upcoming appointments, etc.  Non-urgent messages can be sent to your provider as well.   To learn more about what you can do with MyChart, go to [email or web address].    Your next appointment:   2 week(s)  The format for your next appointment:   In Person  Provider:   Thomasene Ripple, DO   Other Instructions NA

## 2019-06-16 LAB — BASIC METABOLIC PANEL
BUN/Creatinine Ratio: 9 (ref 9–23)
BUN: 5 mg/dL — ABNORMAL LOW (ref 6–20)
CO2: 20 mmol/L (ref 20–29)
Calcium: 9.4 mg/dL (ref 8.7–10.2)
Chloride: 102 mmol/L (ref 96–106)
Creatinine, Ser: 0.54 mg/dL — ABNORMAL LOW (ref 0.57–1.00)
GFR calc Af Amer: 139 mL/min/{1.73_m2} (ref >59)
GFR calc non Af Amer: 121 mL/min/{1.73_m2} (ref >59)
Glucose: 86 mg/dL (ref 65–99)
Potassium: 3.6 mmol/L (ref 3.5–5.2)
Sodium: 138 mmol/L (ref 134–144)

## 2019-06-16 LAB — MAGNESIUM: Magnesium: 1.8 mg/dL (ref 1.6–2.3)

## 2019-06-16 LAB — TSH: TSH: 0.904 u[IU]/mL (ref 0.450–4.500)

## 2019-06-18 ENCOUNTER — Telehealth: Payer: Self-pay

## 2019-06-18 ENCOUNTER — Telehealth: Payer: Self-pay | Admitting: Cardiology

## 2019-06-18 MED ORDER — HYDRALAZINE HCL 10 MG PO TABS: 10.0000 mg | ORAL_TABLET | Freq: Four times a day (QID) | ORAL | 3 refills | Status: AC

## 2019-06-18 NOTE — Telephone Encounter (Signed)
New message:      Pt said on Friday  Dr Tomie China prescribed Methyldopa for her. When she went to the pharmacy to get it, they said they medicine had not been available since 2018.

## 2019-06-18 NOTE — Telephone Encounter (Signed)
Pt is aware that Dr. Tomie China is out until lunch and at that time I will address her medication and give her a callback.

## 2019-06-18 NOTE — Telephone Encounter (Signed)
Called and spoke with the pt regarding recommendations of Dr. Tomie China as well as the pharmacist. Pt is aware of risk and has spoken with her OB-GYN and will also see a high risk specialist this week.

## 2019-06-19 ENCOUNTER — Telehealth: Payer: Self-pay | Admitting: Pharmacist

## 2019-06-19 NOTE — Telephone Encounter (Signed)
Methyldopa 250mg  is available at [facility] pharmacy in [facility].  Called in prescription for 60 tablets 1 tab BID.  Called patient and let her know medication will be ready for pickup      Please help is required methyldopa and when you get it then I would like to continue with L-dopa and stop hydralazine.

## 2019-06-20 NOTE — Telephone Encounter (Signed)
Message left for the pt regarding recommendations on medication changes.

## 2019-06-20 NOTE — Telephone Encounter (Signed)
Ladonna Snide please let the patient know to start methyldopa as initiated before and stop the hydralazine.  Keep a track of her blood pressures.  She has an appointment coming up in the next week or so.  Thank you Cristal Deer

## 2019-06-29 ENCOUNTER — Other Ambulatory Visit: Payer: Self-pay | Admitting: Cardiology

## 2019-07-02 ENCOUNTER — Ambulatory Visit: Payer: BLUE CROSS/BLUE SHIELD | Admitting: Cardiology

## 2019-07-24 ENCOUNTER — Other Ambulatory Visit: Payer: Self-pay | Admitting: Cardiology

## 2019-07-24 DIAGNOSIS — I10 Essential (primary) hypertension: Secondary | ICD-10-CM

## 2019-07-24 DIAGNOSIS — I493 Ventricular premature depolarization: Secondary | ICD-10-CM

## 2019-07-24 NOTE — Addendum Note (Signed)
Addended by: Roosvelt Harps R on: 07/24/2019 08:50 AM   Modules accepted: Orders

## 2020-06-09 ENCOUNTER — Telehealth: Payer: Self-pay

## 2020-06-09 NOTE — Telephone Encounter (Signed)
LVM advising patient to call back and schedule TOC.
